# Patient Record
Sex: Female | Born: 1995 | Race: White | Hispanic: Yes | Marital: Married | Smoking: Never smoker
Health system: Southern US, Community
[De-identification: ages and names within clinical notes are randomized; demographics above are authoritative.]

## PROBLEM LIST (undated history)

## (undated) DIAGNOSIS — R000 Tachycardia, unspecified: Secondary | ICD-10-CM

## (undated) DIAGNOSIS — J45909 Unspecified asthma, uncomplicated: Secondary | ICD-10-CM

## (undated) DIAGNOSIS — Z789 Other specified health status: Secondary | ICD-10-CM

## (undated) DIAGNOSIS — R55 Syncope and collapse: Secondary | ICD-10-CM

## (undated) HISTORY — PX: NO PAST SURGERIES: SHX2092

---

## 2020-04-08 ENCOUNTER — Other Ambulatory Visit: Payer: Self-pay

## 2020-04-08 ENCOUNTER — Inpatient Hospital Stay: Payer: 59

## 2020-04-08 ENCOUNTER — Inpatient Hospital Stay (HOSPITAL_COMMUNITY): Payer: 59

## 2020-04-08 ENCOUNTER — Encounter (HOSPITAL_COMMUNITY): Payer: Self-pay | Admitting: Obstetrics and Gynecology

## 2020-04-08 ENCOUNTER — Inpatient Hospital Stay (HOSPITAL_COMMUNITY)
Admission: AD | Admit: 2020-04-08 | Discharge: 2020-04-08 | Disposition: A | Discharge status: Home / Self Care | Payer: 59 | Attending: Obstetrics and Gynecology | Admitting: Obstetrics and Gynecology

## 2020-04-08 DIAGNOSIS — O26891 Other specified pregnancy related conditions, first trimester: Secondary | ICD-10-CM | POA: Diagnosis not present

## 2020-04-08 DIAGNOSIS — O00101 Right tubal pregnancy without intrauterine pregnancy: Secondary | ICD-10-CM | POA: Diagnosis not present

## 2020-04-08 DIAGNOSIS — R1030 Lower abdominal pain, unspecified: Secondary | ICD-10-CM | POA: Insufficient documentation

## 2020-04-08 DIAGNOSIS — O009 Unspecified ectopic pregnancy without intrauterine pregnancy: Secondary | ICD-10-CM | POA: Diagnosis present

## 2020-04-08 DIAGNOSIS — Z3A01 Less than 8 weeks gestation of pregnancy: Secondary | ICD-10-CM | POA: Insufficient documentation

## 2020-04-08 DIAGNOSIS — O209 Hemorrhage in early pregnancy, unspecified: Secondary | ICD-10-CM

## 2020-04-08 HISTORY — DX: Other specified health status: Z78.9

## 2020-04-08 HISTORY — DX: Unspecified asthma, uncomplicated: J45.909

## 2020-04-08 LAB — CBC
HCT: 38.9 % (ref 36.0–46.0)
Hemoglobin: 13 g/dL (ref 12.0–15.0)
MCH: 27.8 pg (ref 26.0–34.0)
MCHC: 33.4 g/dL (ref 30.0–36.0)
MCV: 83.3 fL (ref 80.0–100.0)
Platelets: 230 10*3/uL (ref 150–400)
RBC: 4.67 MIL/uL (ref 3.87–5.11)
RDW: 13.5 % (ref 11.5–15.5)
WBC: 8.2 10*3/uL (ref 4.0–10.5)
nRBC: 0 % (ref 0.0–0.2)

## 2020-04-08 LAB — WET PREP, GENITAL
Clue Cells Wet Prep HPF POC: NONE SEEN
Sperm: NONE SEEN
Trich, Wet Prep: NONE SEEN
Yeast Wet Prep HPF POC: NONE SEEN

## 2020-04-08 LAB — TYPE AND SCREEN
ABO/RH(D): A POS
Antibody Screen: NEGATIVE

## 2020-04-08 LAB — COMPREHENSIVE METABOLIC PANEL
ALT: 23 U/L (ref 0–44)
AST: 19 U/L (ref 15–41)
Albumin: 4 g/dL (ref 3.5–5.0)
Alkaline Phosphatase: 38 U/L (ref 38–126)
Anion gap: 10 (ref 5–15)
BUN: 6 mg/dL (ref 6–20)
CO2: 20 mmol/L — ABNORMAL LOW (ref 22–32)
Calcium: 9.1 mg/dL (ref 8.9–10.3)
Chloride: 106 mmol/L (ref 98–111)
Creatinine, Ser: 0.56 mg/dL (ref 0.44–1.00)
GFR, Estimated: >60 mL/min (ref >60)
Glucose, Bld: 105 mg/dL — ABNORMAL HIGH (ref 70–99)
Potassium: 3.7 mmol/L (ref 3.5–5.1)
Sodium: 136 mmol/L (ref 135–145)
Total Bilirubin: 0.7 mg/dL (ref 0.3–1.2)
Total Protein: 7 g/dL (ref 6.5–8.1)

## 2020-04-08 LAB — URINALYSIS, ROUTINE W REFLEX MICROSCOPIC
Bilirubin Urine: NEGATIVE
Glucose, UA: NEGATIVE mg/dL
Ketones, ur: NEGATIVE mg/dL
Leukocytes,Ua: NEGATIVE
Nitrite: NEGATIVE
Protein, ur: NEGATIVE mg/dL
Specific Gravity, Urine: 1.018 (ref 1.005–1.030)
pH: 7 (ref 5.0–8.0)

## 2020-04-08 LAB — ABO/RH: ABO/RH(D): A POS

## 2020-04-08 LAB — HCG, QUANTITATIVE, PREGNANCY: hCG, Beta Chain, Quant, S: 2438 m[IU]/mL — ABNORMAL HIGH (ref <5)

## 2020-04-08 MED ORDER — METHOTREXATE FOR ECTOPIC PREGNANCY
50.0000 mg/m2 | Freq: Once | INTRAMUSCULAR | Status: AC
  Administered 2020-04-08: 95 mg via INTRAMUSCULAR
  Filled 2020-04-08: qty 1

## 2020-04-08 MED ORDER — ACETAMINOPHEN 325 MG PO TABS: 650.0000 mg | ORAL_TABLET | ORAL | 0 refills | Status: AC | PRN

## 2020-04-08 MED ORDER — PROMETHAZINE HCL 12.5 MG PO TABS: 12.5000 mg | ORAL_TABLET | Freq: Four times a day (QID) | ORAL | 0 refills | Status: DC | PRN

## 2020-04-08 NOTE — MAU Provider Note (Signed)
History     CSN: [id number]  Arrival date and time: 04/08/20 1235  First Provider Initiated Contact with Patient 04/08/20 1438     Chief Complaint  Patient presents with  . Abdominal Pain  . Vaginal Bleeding   HPI Wendy Kidd is a 24 y.o. G1P0 at [redacted]w[redacted]d who presents to MAU with chief complaint of vaginal bleeding and mild abdominal cramping. These are new problems, onset 04/03/2020. Patient states her bleeding started as spotting but has gradually become heavier over time. She denies saturating a pad, weakness, dizziness or syncope. She rates her abdominal pain as 6/10, worse in the morning. She reports accompanying suprapubic tenderness. She has not taken medication or tried other treatments for these complaints. Most recent sexual intercourse was Friday 04/06/2020.  Patient has been NPO since 0800 today when she ate a yogurt and some crackers. She is scheduled for a New OB in the [place] system Tuesday 04/17/2020.  OB History    Gravida  1   Para      Term      Preterm      AB      Living        SAB      TAB      Ectopic      Multiple      Live Births              Past Medical History:  Diagnosis Date  . Asthma   . Medical history non-contributory     Past Surgical History:  Procedure Laterality Date  . NO PAST SURGERIES      No family history on file.  Social History   Tobacco Use  . Smoking status: Never Smoker  . Smokeless tobacco: Never Used  Vaping Use  . Vaping Use: Never used  Substance Use Topics  . Alcohol use: Never  . Drug use: Never    Allergies: No Known Allergies  Medications Prior to Admission  Medication Sig Dispense Refill Last Dose  . Prenatal Vit-Fe Fumarate-FA (MULTIVITAMIN-PRENATAL) 27-0.8 MG TABS tablet Take 1 tablet by mouth daily at 12 noon.       Review of Systems  Gastrointestinal: Positive for abdominal pain.  Genitourinary: Positive for vaginal bleeding.  All other systems reviewed and are  negative.  Physical Exam   Blood pressure 126/62, pulse 80, temperature 98.6 F (37 C), temperature source Oral, resp. rate 16, height $RemoveBe'5\' 8"'RUNiMzbvu$  (1.727 m), weight 71.1 kg, last menstrual period 03/01/2020, SpO2 100 %.  Physical Exam Vitals and nursing note reviewed. Exam conducted with a chaperone present.  Constitutional:      Appearance: She is well-developed.  Cardiovascular:     Rate and Rhythm: Normal rate.     Heart sounds: Normal heart sounds.  Pulmonary:     Effort: Pulmonary effort is normal.     Breath sounds: Normal breath sounds.  Abdominal:     General: Abdomen is flat. Bowel sounds are normal.     Palpations: Abdomen is soft.     Tenderness: There is no abdominal tenderness. There is no right CVA tenderness or left CVA tenderness.  Genitourinary:    Comments: Speculum exam deferred. Scant dark brown discharged noted on pad worn to MAU, no overt bleeding during period of evaluation in MAU Skin:    General: Skin is warm and dry.     Capillary Refill: Capillary refill takes less than 2 seconds.  Neurological:     General: No focal deficit present.  Mental Status: She is alert and oriented to person, place, and time.     MAU Course/MDM  Procedures: formal transvaginal ultrasound  --Patient ROS, imaging and lab results discussed with Dr. Rip Harbour, who agrees patient is appropriate for Methotrexate therapy --Anticipatory guidance given to patient and husband --Patient verbalizes she and her husband are going to [place] for Thanksgiving and will not be in [place] to follow typical MTX followup. Scheduled listed below. Patient to present to MAU Sunday for Day 7 labs rather than Saturday afternoon as advised.    Orders Placed This Encounter  Procedures  . Wet prep, genital  . US OB LESS THAN 14 WEEKS WITH OB TRANSVAGINAL  . Urinalysis, Routine w reflex microscopic Urine, Clean Catch  . CBC  . hCG, quantitative, pregnancy  . Comprehensive metabolic panel  . Diet  NPO time specified  . Nursing communication  . ABO/Rh  . Type and screen [place]   Patient Vitals for the past 24 hrs:  BP Temp Temp src Pulse Resp SpO2 Height Weight  04/08/20 1630 116/66 -- -- 78 16 100 % -- --  04/08/20 1326 126/62 -- -- 80 -- -- -- --  04/08/20 1258 (!) 115/52 98.6 F (37 C) Oral 90 16 100 % $Rem'5\' 8"'yVrS$  (1.727 m) 71.1 kg   Results for orders placed or performed during the hospital encounter of 04/08/20 (from the past 24 hour(s))  Urinalysis, Routine w reflex microscopic Urine, Clean Catch     Status: Abnormal   Collection Time: 04/08/20 12:56 PM  Result Value Ref Range   Color, Urine YELLOW YELLOW   APPearance CLOUDY (A) CLEAR   Specific Gravity, Urine 1.018 1.005 - 1.030   pH 7.0 5.0 - 8.0   Glucose, UA NEGATIVE NEGATIVE mg/dL   Hgb urine dipstick SMALL (A) NEGATIVE   Bilirubin Urine NEGATIVE NEGATIVE   Ketones, ur NEGATIVE NEGATIVE mg/dL   Protein, ur NEGATIVE NEGATIVE mg/dL   Nitrite NEGATIVE NEGATIVE   Leukocytes,Ua NEGATIVE NEGATIVE   RBC / HPF 0-5 0 - 5 RBC/hpf   Bacteria, UA RARE (A) NONE SEEN   Squamous Epithelial / LPF 0-5 0 - 5  CBC     Status: None   Collection Time: 04/08/20  1:20 PM  Result Value Ref Range   WBC 8.2 4.0 - 10.5 K/uL   RBC 4.67 3.87 - 5.11 MIL/uL   Hemoglobin 13.0 12.0 - 15.0 g/dL   HCT 38.9 36 - 46 %   MCV 83.3 80.0 - 100.0 fL   MCH 27.8 26.0 - 34.0 pg   MCHC 33.4 30.0 - 36.0 g/dL   RDW 13.5 11.5 - 15.5 %   Platelets 230 150 - 400 K/uL   nRBC 0.0 0.0 - 0.2 %  hCG, quantitative, pregnancy     Status: Abnormal   Collection Time: 04/08/20  1:20 PM  Result Value Ref Range   hCG, Beta Chain, Quant, S 2,438 (H) <5 mIU/mL  ABO/Rh     Status: None   Collection Time: 04/08/20  1:20 PM  Result Value Ref Range   ABO/RH(D) A POS    No rh immune globuloin      NOT A RH IMMUNE GLOBULIN CANDIDATE, PT RH POSITIVE Performed at [facility], [address], [place], [place] [address]   Wet prep, genital      Status: Abnormal   Collection Time: 04/08/20  1:32 PM   Specimen: PATH Cytology Cervicovaginal Ancillary Only  Result Value Ref Range  Yeast Wet Prep HPF POC NONE SEEN NONE SEEN   Trich, Wet Prep NONE SEEN NONE SEEN   Clue Cells Wet Prep HPF POC NONE SEEN NONE SEEN   WBC, Wet Prep HPF POC FEW (A) NONE SEEN   Sperm NONE SEEN   Comprehensive metabolic panel     Status: Abnormal   Collection Time: 04/08/20  2:59 PM  Result Value Ref Range   Sodium 136 135 - 145 mmol/L   Potassium 3.7 3.5 - 5.1 mmol/L   Chloride 106 98 - 111 mmol/L   CO2 20 (L) 22 - 32 mmol/L   Glucose, Bld 105 (H) 70 - 99 mg/dL   BUN 6 6 - 20 mg/dL   Creatinine, Ser 0.56 0.44 - 1.00 mg/dL   Calcium 9.1 8.9 - 10.3 mg/dL   Total Protein 7.0 6.5 - 8.1 g/dL   Albumin 4.0 3.5 - 5.0 g/dL   AST 19 15 - 41 U/L   ALT 23 0 - 44 U/L   Alkaline Phosphatase 38 38 - 126 U/L   Total Bilirubin 0.7 0.3 - 1.2 mg/dL   GFR, Estimated >60 >60 mL/min   Anion gap 10 5 - 15  Type and screen [place]     Status: None   Collection Time: 04/08/20  2:59 PM  Result Value Ref Range   ABO/RH(D) A POS    Antibody Screen NEG    Sample Expiration      04/11/2020,2359 Performed at [facility], [place] [address], [place], [place] [address]    US OB LESS THAN 14 WEEKS WITH [place] TRANSVAGINAL  Result Date: 04/08/2020 CLINICAL DATA:  Pregnant patient with bleeding. EXAM: OBSTETRIC <14 WK [place] AND TRANSVAGINAL OB US TECHNIQUE: Both transabdominal and transvaginal ultrasound examinations were performed for complete evaluation of the gestation as well as the maternal uterus, adnexal regions, and pelvic cul-de-sac. Transvaginal technique was performed to assess early pregnancy. COMPARISON:  None. FINDINGS: Intrauterine gestational sac: None Maternal uterus/adnexae: Both ovaries are normal in appearance. There is a thick walled cystic structure adjacent to the right ovary. This appears to be separate from the right ovary on  real-time/cine imaging. There may be a subtle yolk sac within this structure. The endometrium is thickened. There are multiple cysts in the endometrium. There is a small amount of simple appearing fluid in the pelvis. IMPRESSION: 1. The findings are consistent with an ectopic pregnancy containing a gestational sac and possibly a tiny yolk sac. 2. There is a small amount of simple fluid in the pelvis. 3. The endometrium is thickened and contains multiple cysts but no intrauterine pregnancy. Findings called to Ms. Maryelizabeth Kaufmann, Nurse Midwife Electronically Signed   By: Dorise Bullion III M.D   On: 04/08/2020 15:14   Methotrexate Treatment Protocol for Ectopic Pregnancy  Pretreatment testing and instructions  hCG concentration  Transvaginal ultrasound  Blood group and Rh(D) typing; give Rhogam 300 mcg IM, if indicated  Complete blood count  Liver and renal function tests  Discontinue folic acid supplements  Counsel patient to avoid NSAIDs, recommend acetaminophen if an analgesic is needed  Advise patient to refrain from sexual intercourse and strenuous exercise   Treatment day  Single dose protocol   1 Sun 11/21 hCG.  Administer Methotrexate 50 mg/m2 body surface area IM  4  Wed 11/24 hCG  7 Sat 11/27 hCG  If <15 percent hCG decline from day 4 to 7, give additional dose of methotrexate 50 mg/m2 IM  If ?  15 percent hCG decline from day 4 to 7, draw hCG weekly until undetectable    The hCG concentration usually declines to less than 15 mIU/mL by 35 days postinjection but may take as long as 109 days. If the hCG does not decline to zero, a new pregnancy should be excluded; if the hCG is rising, a transvaginal ultrasound should be performed. Alternatively, some patients have a slow clearance of serum hCG. If three weekly values are similar, consider an additional dose of MTX (50 mg/m2) not to exceed the recommended maximum of three total doses. This typically accelerates the decline of serum hCG. The  risk of gestational trophoblastic disease is low. Folinic acid rescue is not required for women treated with the single-dose protocol, even if multiple doses are ultimately given.   Prepared with data from:   [facility] Clinical practice. Ectopic pregnancy. [place] Med 2009; 361:379  American College of Obstetricians and Gynecologists. ACOG Practice Bulletin No. 94: Medical management of ectopic pregnancy. Obstet Gynecol 2008; 473:4037.  Meds ordered this encounter  Medications  . methotrexate [facility]) chemo injection kit 95 mg    Right adnexal ectopic pregnancy  . acetaminophen (TYLENOL) 325 MG tablet    Sig: Take 2 tablets (650 mg total) by mouth every 4 (four) hours as needed for up to 7 days for moderate pain.    Dispense:  42 tablet    Refill:  0    Order Specific Question:   Supervising Provider    Answer:   ERVIN, MICHAEL L [[id number]]  . promethazine (PHENERGAN) 12.5 MG tablet    Sig: Take 1 tablet (12.5 mg total) by mouth every 6 (six) hours as needed for nausea or vomiting.    Dispense:  30 tablet    Refill:  0    Order Specific Question:   Supervising Provider    Answer:   Arlina Robes L [[id number]]   Assessment and Plan  --24 y.o. G1P0 with right ectopic pregnancy --S/p Methotrexate per consult with Dr. Rip Harbour --Blood type A POS --Discharge home in stable condition  F/U: --Day 4 labs Wednesday morning in MAU rather than Wednesday at lunchtime (per patient's travel schedule) --Day 7 labs Sunday in MAU rather than Saturday (per patient's travel schedule)  Darlina Rumpf, CNM 04/08/2020, 4:50 PM

## 2020-04-08 NOTE — Discharge Instructions (Signed)
Methotrexate Treatment for an Ectopic Pregnancy, Care After This sheet gives you information about how to care for yourself after your procedure. Your health care provider may also give you more specific instructions. If you have problems or questions, contact your health care provider. What can I expect after the procedure? After the procedure, it is common to have:  Abdominal cramping.  Vaginal bleeding.  Fatigue.  Nausea.  Vomiting.  Diarrhea. Blood tests will be taken at timed intervals for several days or weeks to check your pregnancy hormone levels. The blood tests will be done until the pregnancy hormone can no longer be detected in the blood. Follow these instructions at home: Activity  Do not have sex until your health care provider approves.  Limit activities that take a lot of effort as told by your health care provider. Medicines  Take over the counter and prescription medicines only as told by your health care provider.  Do not take aspirin, ibuprofen, naproxen, or any other NSAIDs.  Do not take folic acid, prenatal vitamins, or other vitamins that contain folic acid. General instructions   Do not drink alcohol.  Follow instructions from your health care provider on how and when to report any symptoms that may indicate a ruptured ectopic pregnancy.  Keep all follow-up visits as told by your health care provider. This is important. Contact a health care provider if:  You have persistent nausea and vomiting.  You have persistent diarrhea.  You are having a reaction to the medicine, such as: ? Tiredness. ? Skin rash. ? Hair loss. Get help right away if:  Your abdominal or pelvic pain gets worse.  You have more vaginal bleeding.  You feel light-headed or you faint.  You have shortness of breath.  Your heart rate increases.  You develop a cough.  You have chills.  You have a fever. Summary  After the procedure, it is common to have symptoms  of abdominal cramping, vaginal bleeding and fatigue. You may also experience other symptoms.  Blood tests will be taken at timed intervals for several days or weeks to check your pregnancy hormone levels. The blood tests will be done until the pregnancy hormone can no longer be detected in the blood.  Limit strenuous activity as told by your health care provider.  Follow instructions from your health care provider on how and when to report any symptoms that may indicate a ruptured ectopic pregnancy. This information is not intended to replace advice given to you by your health care provider. Make sure you discuss any questions you have with your health care provider. Document Revised: 04/17/2017 Document Reviewed: 06/24/2016 Elsevier Patient Education  2020 Elsevier Inc.  

## 2020-04-08 NOTE — MAU Note (Signed)
Wendy Kidd is a 24 y.o. at [redacted]w[redacted]d here in MAU reporting: had + UPT on Wednesday. Has been having some spotting and today it has gotten a little bit heavier. Also having some lower abdominal pain.   LMP: 03/01/20  Onset of complaint: ongoing  Pain score: 6/10  Vitals:   04/08/20 1258  BP: (!) 115/52  Pulse: 90  Resp: 16  Temp: 98.6 F (37 C)  SpO2: 100%     Lab orders placed from triage: UA UPT

## 2020-04-09 LAB — GC/CHLAMYDIA PROBE AMP (~~LOC~~) NOT AT ARMC
Chlamydia: NEGATIVE
Comment: NEGATIVE
Comment: NORMAL
Neisseria Gonorrhea: NEGATIVE

## 2020-04-11 ENCOUNTER — Inpatient Hospital Stay (HOSPITAL_COMMUNITY)
Admission: AD | Admit: 2020-04-11 | Discharge: 2020-04-11 | Discharge status: Against Medical Advice | Payer: 59 | Attending: Obstetrics and Gynecology | Admitting: Obstetrics and Gynecology

## 2020-04-11 ENCOUNTER — Other Ambulatory Visit: Payer: Self-pay

## 2020-04-11 DIAGNOSIS — Z3A01 Less than 8 weeks gestation of pregnancy: Secondary | ICD-10-CM | POA: Insufficient documentation

## 2020-04-11 DIAGNOSIS — Z5329 Procedure and treatment not carried out because of patient's decision for other reasons: Secondary | ICD-10-CM | POA: Insufficient documentation

## 2020-04-11 DIAGNOSIS — O009 Unspecified ectopic pregnancy without intrauterine pregnancy: Secondary | ICD-10-CM | POA: Insufficient documentation

## 2020-04-11 DIAGNOSIS — O26891 Other specified pregnancy related conditions, first trimester: Secondary | ICD-10-CM | POA: Diagnosis present

## 2020-04-11 LAB — HCG, QUANTITATIVE, PREGNANCY: hCG, Beta Chain, Quant, S: 5335 m[IU]/mL — ABNORMAL HIGH (ref <5)

## 2020-04-11 NOTE — MAU Provider Note (Signed)
Subjective:  Wendy Kidd is a 24 y.o. G1P0 at [redacted]w[redacted]d who presents today for FU BHCG. She was seen on 11/21. Results from that day show an ectopic pregnancy; abdominal or pelvic pain. Her Quant on 11/21 was 2438   Objective:  Physical Exam  Nursing note and vitals reviewed. Constitutional: She is oriented to person, place, and time. She appears well-developed and well-nourished. No distress.  HENT:  Head: Normocephalic.  Cardiovascular: Normal rate.  Respiratory: Effort normal.  GI: Soft. There is no tenderness.  Neurological: She is alert and oriented to person, place, and time. Skin: Skin is warm and dry.  Psychiatric: She has a normal mood and affect.   Results for orders placed or performed during the hospital encounter of 04/11/20 (from the past 24 hour(s))  hCG, quantitative, pregnancy     Status: Abnormal   Collection Time: 04/11/20 10:11 AM  Result Value Ref Range   hCG, Beta Chain, Quant, S 5,335 (H) <5 mIU/mL    Patient left MAU prior to being seen.  MSE not done Attempted to call the patient multiple times to discuss results. No answer   Assessment/Plan: Ectopic pregnancy Status post MTX HCG level doubled. Return Sunday for stat labs.   Duane Lope, NP 04/14/2020 4:11 PM

## 2020-04-11 NOTE — MAU Note (Signed)
Patient called from lobby, no response. 

## 2020-04-11 NOTE — MAU Note (Signed)
Patient called from lobby, no answer

## 2020-04-11 NOTE — MAU Note (Signed)
Patient called from lobby X2, no answer

## 2020-04-14 ENCOUNTER — Telehealth: Payer: Self-pay | Admitting: Obstetrics and Gynecology

## 2020-04-14 NOTE — Telephone Encounter (Signed)
Discussed Hcg levels (day 4) labs. She had left prior to speaking to a provider. She is supposed to come back tomorrow for day 7 labs, discussed importance of coming to MAU.  She reports no pain at this time, did have some mild cramping throughout the last few days. Continues to have mild spotting.  Duane Lope, NP 04/14/2020 6:10 PM

## 2020-04-14 NOTE — Telephone Encounter (Signed)
Attempted to call patient x2.  It is very important that the patient come in for day 7 labs following MTX.   Wendy Kidd I, NP  

## 2020-04-15 ENCOUNTER — Inpatient Hospital Stay (HOSPITAL_COMMUNITY): Payer: 59 | Admitting: Registered Nurse

## 2020-04-15 ENCOUNTER — Other Ambulatory Visit: Payer: Self-pay

## 2020-04-15 ENCOUNTER — Inpatient Hospital Stay (HOSPITAL_COMMUNITY): Payer: 59

## 2020-04-15 ENCOUNTER — Ambulatory Visit (HOSPITAL_COMMUNITY)
Admission: AD | Admit: 2020-04-15 | Discharge: 2020-04-16 | Disposition: A | Discharge status: Home / Self Care | Payer: 59 | Attending: Family Medicine | Admitting: Family Medicine

## 2020-04-15 ENCOUNTER — Encounter (HOSPITAL_COMMUNITY): Payer: Self-pay | Admitting: Family Medicine

## 2020-04-15 ENCOUNTER — Encounter (HOSPITAL_COMMUNITY)
Admission: AD | Disposition: A | Discharge status: Home / Self Care | Payer: Self-pay | Source: Home / Self Care | Attending: Family Medicine

## 2020-04-15 DIAGNOSIS — O00109 Unspecified tubal pregnancy without intrauterine pregnancy: Secondary | ICD-10-CM | POA: Diagnosis not present

## 2020-04-15 DIAGNOSIS — Z20822 Contact with and (suspected) exposure to covid-19: Secondary | ICD-10-CM | POA: Insufficient documentation

## 2020-04-15 DIAGNOSIS — O00101 Right tubal pregnancy without intrauterine pregnancy: Secondary | ICD-10-CM | POA: Diagnosis not present

## 2020-04-15 DIAGNOSIS — O009 Unspecified ectopic pregnancy without intrauterine pregnancy: Secondary | ICD-10-CM | POA: Diagnosis present

## 2020-04-15 DIAGNOSIS — Z3A01 Less than 8 weeks gestation of pregnancy: Secondary | ICD-10-CM | POA: Insufficient documentation

## 2020-04-15 HISTORY — PX: DIAGNOSTIC LAPAROSCOPY WITH REMOVAL OF ECTOPIC PREGNANCY: SHX6449

## 2020-04-15 LAB — HCG, QUANTITATIVE, PREGNANCY: hCG, Beta Chain, Quant, S: 3420 m[IU]/mL — ABNORMAL HIGH (ref <5)

## 2020-04-15 LAB — CBC
HCT: 38.5 % (ref 36.0–46.0)
Hemoglobin: 12.4 g/dL (ref 12.0–15.0)
MCH: 27.3 pg (ref 26.0–34.0)
MCHC: 32.2 g/dL (ref 30.0–36.0)
MCV: 84.6 fL (ref 80.0–100.0)
Platelets: 265 10*3/uL (ref 150–400)
RBC: 4.55 MIL/uL (ref 3.87–5.11)
RDW: 13.9 % (ref 11.5–15.5)
WBC: 13.7 10*3/uL — ABNORMAL HIGH (ref 4.0–10.5)
nRBC: 0 % (ref 0.0–0.2)

## 2020-04-15 LAB — RESP PANEL BY RT-PCR (FLU A&B, COVID) ARPGX2
Influenza A by PCR: NEGATIVE
Influenza B by PCR: NEGATIVE
SARS Coronavirus 2 by RT PCR: NEGATIVE

## 2020-04-15 LAB — TYPE AND SCREEN
ABO/RH(D): A POS
Antibody Screen: NEGATIVE

## 2020-04-15 SURGERY — LAPAROSCOPY, WITH ECTOPIC PREGNANCY SURGICAL TREATMENT
Anesthesia: General | Laterality: Right

## 2020-04-15 MED ORDER — MIDAZOLAM HCL 2 MG/2ML IJ SOLN
INTRAMUSCULAR | Status: AC
  Filled 2020-04-15: qty 2

## 2020-04-15 MED ORDER — FENTANYL CITRATE (PF) 250 MCG/5ML IJ SOLN
INTRAMUSCULAR | Status: AC
  Filled 2020-04-15: qty 5

## 2020-04-15 MED ORDER — HYDROMORPHONE HCL 1 MG/ML IJ SOLN
INTRAMUSCULAR | Status: AC
  Filled 2020-04-15: qty 1

## 2020-04-15 MED ORDER — ONDANSETRON HCL 4 MG/2ML IJ SOLN
INTRAMUSCULAR | Status: DC | PRN
  Administered 2020-04-15: 4 mg via INTRAVENOUS

## 2020-04-15 MED ORDER — KETOROLAC TROMETHAMINE 30 MG/ML IJ SOLN
INTRAMUSCULAR | Status: DC | PRN
  Administered 2020-04-15: 30 mg via INTRAVENOUS

## 2020-04-15 MED ORDER — PROMETHAZINE HCL 25 MG/ML IJ SOLN: 6.2500 mg | INTRAMUSCULAR | Status: DC | PRN

## 2020-04-15 MED ORDER — LIDOCAINE 2% (20 MG/ML) 5 ML SYRINGE
INTRAMUSCULAR | Status: DC | PRN
  Administered 2020-04-15: 20 mg via INTRAVENOUS

## 2020-04-15 MED ORDER — BUPIVACAINE HCL (PF) 0.25 % IJ SOLN
INTRAMUSCULAR | Status: AC
  Filled 2020-04-15: qty 30

## 2020-04-15 MED ORDER — SODIUM CHLORIDE 0.9 % IR SOLN
Status: DC | PRN
  Administered 2020-04-15: 3000 mL

## 2020-04-15 MED ORDER — SODIUM CHLORIDE 0.9 % IV SOLN: INTRAVENOUS | Status: DC | PRN

## 2020-04-15 MED ORDER — ARTIFICIAL TEARS OPHTHALMIC OINT
TOPICAL_OINTMENT | OPHTHALMIC | Status: DC | PRN
  Administered 2020-04-15: 1 via OPHTHALMIC

## 2020-04-15 MED ORDER — DEXAMETHASONE SODIUM PHOSPHATE 10 MG/ML IJ SOLN
INTRAMUSCULAR | Status: DC | PRN
  Administered 2020-04-15: 10 mg via INTRAVENOUS

## 2020-04-15 MED ORDER — MIDAZOLAM HCL 5 MG/5ML IJ SOLN
INTRAMUSCULAR | Status: DC | PRN
  Administered 2020-04-15: 2 mg via INTRAVENOUS

## 2020-04-15 MED ORDER — FENTANYL CITRATE (PF) 250 MCG/5ML IJ SOLN
INTRAMUSCULAR | Status: DC | PRN
  Administered 2020-04-15 (×2): 50 ug via INTRAVENOUS
  Administered 2020-04-15 (×2): 25 ug via INTRAVENOUS
  Administered 2020-04-15 (×2): 50 ug via INTRAVENOUS

## 2020-04-15 MED ORDER — OXYCODONE HCL 5 MG PO TABS: 5.0000 mg | ORAL_TABLET | Freq: Four times a day (QID) | ORAL | 0 refills | Status: DC | PRN

## 2020-04-15 MED ORDER — EPHEDRINE 5 MG/ML INJ
INTRAVENOUS | Status: AC
  Filled 2020-04-15: qty 10

## 2020-04-15 MED ORDER — HYDROMORPHONE HCL 1 MG/ML IJ SOLN
0.2500 mg | INTRAMUSCULAR | Status: DC | PRN
  Administered 2020-04-15 (×2): 0.25 mg via INTRAVENOUS
  Administered 2020-04-15: 0.5 mg via INTRAVENOUS

## 2020-04-15 MED ORDER — PROPOFOL 10 MG/ML IV BOLUS
INTRAVENOUS | Status: DC | PRN
  Administered 2020-04-15: 125 mg via INTRAVENOUS

## 2020-04-15 MED ORDER — ROCURONIUM BROMIDE 10 MG/ML (PF) SYRINGE
PREFILLED_SYRINGE | INTRAVENOUS | Status: AC
  Filled 2020-04-15: qty 10

## 2020-04-15 MED ORDER — OXYCODONE HCL 5 MG PO TABS: 5.0000 mg | ORAL_TABLET | Freq: Once | ORAL | Status: DC | PRN

## 2020-04-15 MED ORDER — MEPERIDINE HCL 25 MG/ML IJ SOLN: 6.2500 mg | INTRAMUSCULAR | Status: DC | PRN

## 2020-04-15 MED ORDER — SCOPOLAMINE 1 MG/3DAYS TD PT72
MEDICATED_PATCH | TRANSDERMAL | Status: AC
  Filled 2020-04-15: qty 1

## 2020-04-15 MED ORDER — ROCURONIUM BROMIDE 10 MG/ML (PF) SYRINGE
PREFILLED_SYRINGE | INTRAVENOUS | Status: DC | PRN
  Administered 2020-04-15: 50 mg via INTRAVENOUS

## 2020-04-15 MED ORDER — DIPHENHYDRAMINE HCL 50 MG/ML IJ SOLN
INTRAMUSCULAR | Status: DC | PRN
  Administered 2020-04-15: 6.25 mg via INTRAVENOUS

## 2020-04-15 MED ORDER — SUCCINYLCHOLINE CHLORIDE 200 MG/10ML IV SOSY
PREFILLED_SYRINGE | INTRAVENOUS | Status: DC | PRN
  Administered 2020-04-15: 100 mg via INTRAVENOUS

## 2020-04-15 MED ORDER — MIDAZOLAM HCL 2 MG/2ML IJ SOLN: 0.5000 mg | Freq: Once | INTRAMUSCULAR | Status: DC | PRN

## 2020-04-15 MED ORDER — PHENYLEPHRINE 40 MCG/ML (10ML) SYRINGE FOR IV PUSH (FOR BLOOD PRESSURE SUPPORT)
PREFILLED_SYRINGE | INTRAVENOUS | Status: AC
  Filled 2020-04-15: qty 10

## 2020-04-15 MED ORDER — SCOPOLAMINE 1 MG/3DAYS TD PT72
MEDICATED_PATCH | TRANSDERMAL | Status: DC | PRN
  Administered 2020-04-15: 1 via TRANSDERMAL

## 2020-04-15 MED ORDER — OXYCODONE HCL 5 MG/5ML PO SOLN: 5.0000 mg | Freq: Once | ORAL | Status: DC | PRN

## 2020-04-15 MED ORDER — SUGAMMADEX SODIUM 200 MG/2ML IV SOLN
INTRAVENOUS | Status: DC | PRN
  Administered 2020-04-15: 200 mg via INTRAVENOUS

## 2020-04-15 MED ORDER — PROPOFOL 10 MG/ML IV BOLUS
INTRAVENOUS | Status: AC
  Filled 2020-04-15: qty 20

## 2020-04-15 MED ORDER — LIDOCAINE HCL (PF) 2 % IJ SOLN
INTRAMUSCULAR | Status: AC
  Filled 2020-04-15: qty 5

## 2020-04-15 MED ORDER — SUCCINYLCHOLINE CHLORIDE 200 MG/10ML IV SOSY
PREFILLED_SYRINGE | INTRAVENOUS | Status: AC
  Filled 2020-04-15: qty 10

## 2020-04-15 MED ORDER — BUPIVACAINE HCL (PF) 0.25 % IJ SOLN
INTRAMUSCULAR | Status: DC | PRN
  Administered 2020-04-15: 13 mL

## 2020-04-15 SURGICAL SUPPLY — 28 items
BLADE SURG 15 STRL LF DISP TIS (BLADE) ×1 IMPLANT
BLADE SURG 15 STRL SS (BLADE) ×2
COVER MAYO STAND STRL (DRAPES) ×3 IMPLANT
DRSG OPSITE POSTOP 3X4 (GAUZE/BANDAGES/DRESSINGS) ×3 IMPLANT
DURAPREP 26ML APPLICATOR (WOUND CARE) ×3 IMPLANT
GLOVE BIOGEL PI IND STRL 7.0 (GLOVE) ×4 IMPLANT
GLOVE BIOGEL PI INDICATOR 7.0 (GLOVE) ×8
GLOVE ECLIPSE 7.0 STRL STRAW (GLOVE) ×3 IMPLANT
GOWN STRL REUS W/ TWL LRG LVL3 (GOWN DISPOSABLE) ×3 IMPLANT
GOWN STRL REUS W/TWL LRG LVL3 (GOWN DISPOSABLE) ×6
KIT TURNOVER KIT B (KITS) ×3 IMPLANT
PACK LAPAROSCOPY BASIN (CUSTOM PROCEDURE TRAY) ×3 IMPLANT
PACK TRENDGUARD 450 HYBRID PRO (MISCELLANEOUS) ×1 IMPLANT
POUCH SPECIMEN RETRIEVAL 10MM (ENDOMECHANICALS) ×3 IMPLANT
PROTECTOR NERVE ULNAR (MISCELLANEOUS) ×6 IMPLANT
SET IRRIG TUBING LAPAROSCOPIC (IRRIGATION / IRRIGATOR) ×3 IMPLANT
SET TUBE SMOKE EVAC HIGH FLOW (TUBING) ×3 IMPLANT
SHEARS HARMONIC ACE PLUS 36CM (ENDOMECHANICALS) ×3 IMPLANT
SLEEVE ENDOPATH XCEL 5M (ENDOMECHANICALS) ×3 IMPLANT
SUT VIC AB 3-0 X1 27 (SUTURE) ×3 IMPLANT
SUT VICRYL 0 UR6 27IN ABS (SUTURE) ×6 IMPLANT
TOWEL GREEN STERILE FF (TOWEL DISPOSABLE) ×6 IMPLANT
TRAY FOLEY W/BAG SLVR 14FR (SET/KITS/TRAYS/PACK) ×3 IMPLANT
TRENDGUARD 450 HYBRID PRO PACK (MISCELLANEOUS) ×3
TROCAR BALLN 12MMX100 BLUNT (TROCAR) ×3 IMPLANT
TROCAR XCEL NON-BLD 11X100MML (ENDOMECHANICALS) ×3 IMPLANT
TROCAR XCEL NON-BLD 5MMX100MML (ENDOMECHANICALS) ×3 IMPLANT
WARMER LAPAROSCOPE (MISCELLANEOUS) ×3 IMPLANT

## 2020-04-15 NOTE — Anesthesia Postprocedure Evaluation (Signed)
Anesthesia Post Note  Patient: Wendy Kidd  Procedure(s) Performed: DIAGNOSTIC LAPAROSCOPY WITH REMOVAL OF ECTOPIC PREGNANCY (Right )     Patient location during evaluation: PACU Anesthesia Type: General Level of consciousness: awake and alert, patient cooperative and oriented Pain management: pain level controlled Vital Signs Assessment: post-procedure vital signs reviewed and stable Respiratory status: spontaneous breathing, nonlabored ventilation and respiratory function stable Cardiovascular status: blood pressure returned to baseline and stable Postop Assessment: no apparent nausea or vomiting and adequate PO intake Anesthetic complications: no   No complications documented.  Last Vitals:  Vitals:   04/15/20 2240 04/15/20 2253  BP:  97/80  Pulse:  (!) 101  Resp:  (!) 21  Temp: 36.7 C   SpO2:  100%    Last Pain:  Vitals:   04/15/20 2314  TempSrc:   PainSc: 4                  Asli Tokarski,E. Donatello Kleve

## 2020-04-15 NOTE — Anesthesia Procedure Notes (Signed)
Procedure Name: Intubation Date/Time: 04/15/2020 9:30 PM Performed by: Jearld Pies, CRNA Pre-anesthesia Checklist: Patient identified, Emergency Drugs available, Suction available and Patient being monitored Patient Re-evaluated:Patient Re-evaluated prior to induction Oxygen Delivery Method: Circle System Utilized Preoxygenation: Pre-oxygenation with 100% oxygen Induction Type: IV induction, Rapid sequence and Cricoid Pressure applied Laryngoscope Size: Mac and 3 Grade View: Grade I Tube type: Oral Tube size: 7.0 mm Number of attempts: 1 Airway Equipment and Method: Stylet Placement Confirmation: ETT inserted through vocal cords under direct vision,  positive ETCO2 and breath sounds checked- equal and bilateral Secured at: 21 cm Tube secured with: Tape Dental Injury: Teeth and Oropharynx as per pre-operative assessment

## 2020-04-15 NOTE — Transfer of Care (Signed)
Immediate Anesthesia Transfer of Care Note  Patient: Wendy Kidd  Procedure(s) Performed: DIAGNOSTIC LAPAROSCOPY WITH REMOVAL OF ECTOPIC PREGNANCY (Right )  Patient Location: PACU  Anesthesia Type:General  Level of Consciousness: awake, alert  and oriented  Airway & Oxygen Therapy: Patient Spontanous Breathing  Post-op Assessment: Report given to RN and Post -op Vital signs reviewed and stable  Post vital signs: Reviewed and stable  Last Vitals:  Vitals Value Taken Time  BP 109/62 04/15/20 2237  Temp    Pulse 93 04/15/20 2240  Resp 28 04/15/20 2240  SpO2 100 % 04/15/20 2240  Vitals shown include unvalidated device data.  Last Pain:  Vitals:   04/15/20 1929  TempSrc:   PainSc: 0-No pain         Complications: No complications documented.

## 2020-04-15 NOTE — MAU Provider Note (Signed)
History     CSN: [id number]  Arrival date and time: 04/15/20 1727   None     Chief Complaint  Patient presents with  . Follow-up   Wendy Kidd is a 24 y.o. G1P0 at [redacted]w[redacted]d with known ectopic pregnancy.  She presents today for Follow-up.  Patient reports pain in lower abdominal area that has worsened in the past 3 days, but is improved with tylenol dosing.  However, patient states pain is worsened with movement despite tylenol. Patient denies issues with She reports continued spotting and denies issues with urination.      OB History    Gravida  1   Para      Term      Preterm      AB      Living        SAB      TAB      Ectopic      Multiple      Live Births              Past Medical History:  Diagnosis Date  . Asthma   . Medical history non-contributory     Past Surgical History:  Procedure Laterality Date  . NO PAST SURGERIES      No family history on file.  Social History   Tobacco Use  . Smoking status: Never Smoker  . Smokeless tobacco: Never Used  Vaping Use  . Vaping Use: Never used  Substance Use Topics  . Alcohol use: Never  . Drug use: Never    Allergies: No Known Allergies  Medications Prior to Admission  Medication Sig Dispense Refill Last Dose  . acetaminophen (TYLENOL) 325 MG tablet Take 2 tablets (650 mg total) by mouth every 4 (four) hours as needed for up to 7 days for moderate pain. 42 tablet 0   . promethazine (PHENERGAN) 12.5 MG tablet Take 1 tablet (12.5 mg total) by mouth every 6 (six) hours as needed for nausea or vomiting. 30 tablet 0     Review of Systems  Constitutional: Negative for chills and fever.  Genitourinary: Positive for vaginal bleeding.  Neurological: Negative for dizziness and headaches.   Physical Exam   Blood pressure (!) 125/48, pulse 88, temperature 99.1 F (37.3 C), temperature source Oral, resp. rate 16, height 5\' 8"  (1.727 m), weight 70.5 kg, last menstrual period 03/01/2020, SpO2 99  %.  Physical Exam Vitals and nursing note reviewed.  Constitutional:      General: She is not in acute distress.    Appearance: Normal appearance. She is not toxic-appearing.  HENT:     Head: Normocephalic and atraumatic.  Eyes:     Conjunctiva/sclera: Conjunctivae normal.  Cardiovascular:     Rate and Rhythm: Normal rate and regular rhythm.     Pulses: Normal pulses.     Heart sounds: Normal heart sounds.  Pulmonary:     Effort: Pulmonary effort is normal. No respiratory distress.     Breath sounds: Normal breath sounds.  Abdominal:     General: Bowel sounds are normal. There is no distension.     Palpations: Abdomen is soft.     Tenderness: There is abdominal tenderness. There is no guarding or rebound.  Musculoskeletal:        General: Normal range of motion.     Cervical back: Normal range of motion.  Skin:    General: Skin is warm and dry.  Neurological:     Mental Status: She is alert  and oriented to person, place, and time.  Psychiatric:        Mood and Affect: Mood normal.        Behavior: Behavior normal.        Thought Content: Thought content normal.     MAU Course  Procedures Results for orders placed or performed during the hospital encounter of 04/15/20 (from the past 24 hour(s))  hCG, quantitative, pregnancy     Status: Abnormal   Collection Time: 04/15/20  5:42 PM  Result Value Ref Range   hCG, Beta Chain, Quant, S 3,420 (H) <5 mIU/mL   US OB Transvaginal  Addendum Date: 04/15/2020   ADDENDUM REPORT: 04/15/2020 19:22 ADDENDUM: These results were called by telephone at the time of interpretation on 04/15/2020 at 6:55 pm to provider CNM Gillermo Murdoch , who verbally acknowledged these results. Electronically Signed   By: Tish Frederickson M.D.   On: 04/15/2020 19:22   Result Date: 04/15/2020 CLINICAL DATA:  Known ectopic pending C. Quantitative beta HCG increased to 5,335. Presenting with pain. Gestational age by last menstrual period of 6 weeks and 3  days. Last menstrual period 03/01/2020. EXAM: OBSTETRIC <14 WK [place] AND TRANSVAGINAL OB US TECHNIQUE: Both transabdominal and transvaginal ultrasound examinations were performed for complete evaluation of the gestation as well as the maternal uterus, adnexal regions, and pelvic cul-de-sac. Transvaginal technique was performed to assess early pregnancy. COMPARISON:  Ultrasound pelvis 04/08/2020. FINDINGS: Intrauterine gestational sac: None Yolk sac:  Not Visualized. Embryo:  Not Visualized. Cardiac Activity: Not Visualized. Subchorionic hemorrhage:  None visualized. Maternal uterus/adnexae: Redemonstration of a thick-walled cystic structure within the right adnexa adjacent to the right ovary. No yolk sac or embryo identified within the cystic structure. The right ovary measures 4 x 2.4 x 2.1 cm with a volume of 10.6 mL. The left ovary measures 3.8 x 2.7 x 2.4 cm with a volume of 12.4 ml. Similar-appearing heterogeneous endometrium measuring up to 12 mm with a couple of cystic lesions within the endometrial canal. Large volume complex fluid within the abdomen and pelvis. IMPRESSION: Persistent thick walled cystic structure within the right adnexa adjacent to the right ovary suggestive of a persistent right ectopic pregnancy (no embryo or heart rate identified) with associated large volume complex fluid within the abdomen and pelvis likely representing blood products. Findings concerning for a ruptured ectopic pregnancy. Electronically Signed: By: Tish Frederickson M.D. On: 04/15/2020 19:02     MDM -Labs: bHCG -[place] Assessment and Plan  24 year old Known  Ectopic Abdominal Pain  -Nurse instructed to collect and hold admit labs.  -Patient informed of increased results from Day 4 and concern for rupture with onset of pain.   -Discussed repeating US to confirm that ectopic remains stable. -Patient husband to bedside and updated on POC.  Questions if Isidor Holts is contributing to symptoms and informed that it is  not. -Will send for [place] and await results. -Patient reports last eating this am.   Cherre Robins 04/15/2020, 6:34 PM   Reassessment (7:13 PM)  -[place] results as above and received via phone by D. Lodema Hong, CNM -Nurse's instructed to order Covid swab -Dr. Gildardo Griffes notified of findings. -Provider to bedside and patient notified of findings and recommendation for surgery. -Questions and concerns addressed within this provider's scope.  Encouraged to address surgery related questions/concerns with MD. -Patient and husband expresses appreciation for care received.   Cherre Robins MSN, CNM Advanced Practice Provider, [facility]

## 2020-04-15 NOTE — Op Note (Signed)
PREOPERATIVE DIAGNOSIS: Probable ruptured ectopic pregnancy  POSTOPERATIVE DIAGNOSIS: Same  PROCEDURE: Laparoscopic right salpingectomy   INDICATIONS: 24 y.o. G1P0 at [redacted]w[redacted]d here for with ruptured ectopic pregnancy with blood type A pos. Patient was counseled regarding need for laparoscopic salpingectomy. Risks of surgery including bleeding which may require transfusion or reoperation, infection, injury to bowel or other surrounding organs, need for additional procedures including laparotomy and other postoperative/anesthesia complications were explained to patient.  Written informed consent was obtained  FINDINGS: moderate amount of hemoperitoneum estimated to be about 200cc of blood and clots.  Dilated right fallopian tube containing ectopic gestation. Small normal appearing uterus, normal left fallopian tube, right ovary and left ovary.  ANESTHESIA: General  SPECIMENS: right fallopian tube to pathology  COMPLICATIONS: None immediate  PROCEDURE IN DETAIL:  The patient was taken to the operating room where general anesthesia was administered and was found to be adequate.  She was placed in the dorsal lithotomy position, and was prepped and draped in a sterile manner.  A Foley catheter was inserted into her bladder and attached to constant drainage and a uterine manipulator was then advanced into the uterus .  After an adequate timeout was performed, attention was then turned to the patient's abdomen where a 10-mm skin incision was made in the umbilical fold. Fascia and peritoneum were entered sharply.  A 0 Vicryl suture was used to tag the fascia circumferentially.  A Hassan trocar was placed. The laparoscope was introduced.  A survey of the patient's pelvis and abdomen revealed the findings as above.  Two left lower quadrant ports were placed under direct visualization; 5-mm x 2.  Attention was then turned to the right fallopian tube which was grasped and ligated from the underlying mesosalpinx and  uterine attachment using the Harmonic instrument.  Good hemostasis was noted.  The specimen was placed in an EndoCatch bag and removed from the abdomen intact. Clot and blood removed with the Nezjhat. The abdomen was desufflated, and all instruments were removed.  The umbilicus incision was closed with the afore mentioned Vicryl suture; One more bite taken to ensure good closure across the incision. Fair amount of oozing from lower port and figure of eight used to ensure hemostasis. All skin incisions were closed with a 3-0 Vicryl subcuticular stitch followed by DermaBond. The patient tolerated the procedure well.  All instruments, needles, and sponge counts were correct x 2. The patient was taken to the recovery room in stable condition.   Reva Bores MD 04/15/2020 10:28 PM

## 2020-04-15 NOTE — Anesthesia Preprocedure Evaluation (Addendum)
Anesthesia Evaluation  Patient identified by MRN, date of birth, ID band Patient awake    Reviewed: Allergy & Precautions, NPO status , Patient's Chart, lab work & pertinent test results  History of Anesthesia Complications Negative for: history of anesthetic complications  Airway Mallampati: I  TM Distance: >3 FB Neck ROM: Full    Dental  (+) Dental Advisory Given, Teeth Intact   Pulmonary asthma (has not needed inhaler in several months) ,  04/15/2020 SARS coronavirus NEG   breath sounds clear to auscultation       Cardiovascular negative cardio ROS   Rhythm:Regular Rate:Normal     Neuro/Psych negative neurological ROS     GI/Hepatic negative GI ROS, Neg liver ROS,   Endo/Other  negative endocrine ROS  Renal/GU negative Renal ROS     Musculoskeletal   Abdominal   Peds  Hematology negative hematology ROS (+)   Anesthesia Other Findings   Reproductive/Obstetrics (+) Pregnancy (ectopic)                            Anesthesia Physical Anesthesia Plan  ASA: II and emergent  Anesthesia Plan: General   Post-op Pain Management:    Induction: Intravenous and Rapid sequence  PONV Risk Score and Plan: 3 and Ondansetron, Dexamethasone, Treatment may vary due to age or medical condition and Scopolamine patch - Pre-op  Airway Management Planned: Oral ETT  Additional Equipment: None  Intra-op Plan:   Post-operative Plan: Extubation in OR  Informed Consent: I have reviewed the patients History and Physical, chart, labs and discussed the procedure including the risks, benefits and alternatives for the proposed anesthesia with the patient or authorized representative who has indicated his/her understanding and acceptance.     Dental advisory given  Plan Discussed with: CRNA and Surgeon  Anesthesia Plan Comments:        Anesthesia Quick Evaluation

## 2020-04-15 NOTE — MAU Note (Signed)
Wendy Kidd is a 24 y.o. at [redacted]w[redacted]d here in MAU reporting: here for Day 7 labs post MTX. States she is having some spotting. States she had a lot of cramping yesterday morning and this afternoon, states took tylenol and that helped a lot. Does report lower abdominal pain that started today  Onset of complaint: ongoing  Pain score: 6/10  Vitals:   04/15/20 1738  BP: (!) 125/48  Pulse: 88  Resp: 16  Temp: 99.1 F (37.3 C)  SpO2: 99%     Lab orders placed from triage: hcg

## 2020-04-15 NOTE — Discharge Instructions (Signed)
Diagnostic Laparoscopy, Care After This sheet gives you information about how to care for yourself after your procedure. Your health care provider may also give you more specific instructions. If you have problems or questions, contact your health care provider. What can I expect after the procedure? After the procedure, it is common to have:  Mild discomfort in the abdomen.  Sore throat. Women who have laparoscopy with pelvic examination may have mild cramping and fluid coming from the vagina for a few days after the procedure. Follow these instructions at home: Medicines  Take over-the-counter and prescription medicines only as told by your health care provider.  If you were prescribed an antibiotic medicine, take it as told by your health care provider. Do not stop taking the antibiotic even if you start to feel better. Driving  Do not drive for 24 hours if you were given a medicine to help you relax (sedative) during your procedure.  Do not drive or use heavy machinery while taking prescription pain medicine. Bathing  Do not take baths, swim, or use a hot tub until your health care provider approves. You may take showers. Incision care   Follow instructions from your health care provider about how to take care of your incisions. Make sure you: ? Wash your hands with soap and water before you change your bandage (dressing). If soap and water are not available, use hand sanitizer. ? Change your dressing as told by your health care provider. ? Leave stitches (sutures), skin glue, or adhesive strips in place. These skin closures may need to stay in place for 2 weeks or longer. If adhesive strip edges start to loosen and curl up, you may trim the loose edges. Do not remove adhesive strips completely unless your health care provider tells you to do that.  Check your incision areas every day for signs of infection. Check for: ? Redness, swelling, or pain. ? Fluid or  blood. ? Warmth. ? Pus or a bad smell. Activity  Return to your normal activities as told by your health care provider. Ask your health care provider what activities are safe for you.  Do not lift anything that is heavier than 10 lb (4.5 kg), or the limit that you are told, until your health care provider says that it is safe. General instructions  To prevent or treat constipation while you are taking prescription pain medicine, your health care provider may recommend that you: ? Drink enough fluid to keep your urine pale yellow. ? Take over-the-counter or prescription medicines. ? Eat foods that are high in fiber, such as fresh fruits and vegetables, whole grains, and beans. ? Limit foods that are high in fat and processed sugars, such as fried and sweet foods.  Do not use any products that contain nicotine or tobacco, such as cigarettes and e-cigarettes. If you need help quitting, ask your health care provider.  Keep all follow-up visits as told by your health care provider. This is important. Contact a health care provider if:  You develop shoulder pain.  You feel lightheaded or faint.  You are unable to pass gas or have a bowel movement.  You feel nauseous or you vomit.  You develop a rash.  You have redness, swelling, or pain around any incision.  You have fluid or blood coming from any incision.  Any incision feels warm to the touch.  You have pus or a bad smell coming from any incision.  You have a fever or chills. Get help   right away if:  You have severe pain.  You have vomiting that does not go away.  You have heavy bleeding from the vagina.  Any incision opens.  You have trouble breathing.  You have chest pain. Summary  After the procedure, it is common to have mild discomfort in the abdomen and a sore throat.  Check your incision areas every day for signs of infection.  Return to your normal activities as told by your health care provider. Ask  your health care provider what activities are safe for you. This information is not intended to replace advice given to you by your health care provider. Make sure you discuss any questions you have with your health care provider. Document Revised: 04/17/2017 Document Reviewed: 10/29/2016 Elsevier Patient Education  2020 [employer] Inc[employer]  

## 2020-04-15 NOTE — H&P (Signed)
Wendy Kidd is an 24 y.o. G1P0 female.   Chief Complaint: abdominal pain HPI: Patient with h/o ectopic s/p MTX and rising levels on day 4 with almost double. Who is here now with reports of pain x 24 hours. Day 7 labs reveal HCG which is dropping but her pain is new and u/s reveals ruptured ectopic.  Past Medical History:  Diagnosis Date  . Asthma   . Medical history non-contributory     Past Surgical History:  Procedure Laterality Date  . NO PAST SURGERIES      History reviewed. No pertinent family history. Social History:  reports that she has never smoked. She has never used smokeless tobacco. She reports that she does not drink alcohol and does not use drugs.  Allergies: No Known Allergies  Medications Prior to Admission  Medication Sig Dispense Refill  . acetaminophen (TYLENOL) 325 MG tablet Take 2 tablets (650 mg total) by mouth every 4 (four) hours as needed for up to 7 days for moderate pain. 42 tablet 0  . promethazine (PHENERGAN) 12.5 MG tablet Take 1 tablet (12.5 mg total) by mouth every 6 (six) hours as needed for nausea or vomiting. 30 tablet 0    A comprehensive review of systems was negative.  Blood pressure (!) 125/48, pulse 88, temperature 99.1 F (37.3 C), temperature source Oral, resp. rate 16, height 5\' 8"  (1.727 m), weight 70.5 kg, last menstrual period 03/01/2020, SpO2 99 %. General appearance: alert, cooperative and appears stated age Head: Normocephalic, without obvious abnormality, atraumatic Neck: supple, symmetrical, trachea midline Lungs: normal effort Heart: regular rate and rhythm Abdomen: soft, + tenderness Extremities: Homans sign is negative, no sign of DVT Skin: Skin color, texture, turgor normal. No rashes or lesions Neurologic: Grossly normal   Lab Results  Component Value Date   WBC 8.2 04/08/2020   HGB 13.0 04/08/2020   HCT 38.9 04/08/2020   MCV 83.3 04/08/2020   PLT 230 04/08/2020   A POS   hCG, Beta Chain, Quant, S <5 mIU/mL  3,420High  5,335High   FINDINGS: Intrauterine gestational sac: None  Yolk sac:  Not Visualized.  Embryo:  Not Visualized.  Cardiac Activity: Not Visualized.  Subchorionic hemorrhage:  None visualized.  Maternal uterus/adnexae:  Redemonstration of a thick-walled cystic structure within the right adnexa adjacent to the right ovary. No yolk sac or embryo identified within the cystic structure.  The right ovary measures 4 x 2.4 x 2.1 cm with a volume of 10.6 mL. The left ovary measures 3.8 x 2.7 x 2.4 cm with a volume of 12.4 ml.  Similar-appearing heterogeneous endometrium measuring up to 12 mm with a couple of cystic lesions within the endometrial canal.  Large volume complex fluid within the abdomen and pelvis.  IMPRESSION: Persistent thick walled cystic structure within the right adnexa adjacent to the right ovary suggestive of a persistent right ectopic pregnancy (no embryo or heart rate identified) with associated large volume complex fluid within the abdomen and pelvis likely representing blood products. Findings concerning for a ruptured ectopic pregnancy.  Assessment/Plan Principal Problem:   Ruptured ectopic pregnancy  To OR for laparoscopic removal.  Risks include but are not limited to bleeding, infection, injury to surrounding structures, including bowel, bladder and ureters, blood clots, and death.  Likelihood of success is high.    04/10/2020 04/15/2020, 7:40 PM

## 2020-04-16 ENCOUNTER — Encounter (HOSPITAL_COMMUNITY): Payer: Self-pay | Admitting: Family Medicine

## 2020-04-16 LAB — RPR: RPR Ser Ql: NONREACTIVE

## 2020-04-17 LAB — SURGICAL PATHOLOGY

## 2020-05-19 NOTE — L&D Delivery Note (Signed)
Operative Delivery Note  G2P0010 at presented for IOL due to A1GDM with normal 1st stage and prolonged second stage. The patient was complete at 0830 and pushed effectively for over 8 hours. She rested about 2 hours total between complete and delivery at 5:11PM. I offered vaccuum assisted delivery at 15:00 and patient chose to continue to push. I personally pushed with the patient from 15:30 until delivered. Fetus has a category 2 tracing with deep variables to the 70-80s with return to baseline 150's. I offered vacuum again at 1.0 hours of pushing and patient declined. I offered again at 1.5 hours of pushing with the patient at 17:00. The patient accepted vacuum at this time.  Verbal consent: obtained from patient.  Risks and benefits discussed in detail.  Risks include, but are not limited to the risks of anesthesia, bleeding, infection, damage to maternal tissues, fetal cephalhematoma.  There is also the risk of inability to effect vaginal delivery of the head, or shoulder dystocia that cannot be resolved by established maneuvers, leading to the need for emergency cesarean section.  I applied the Mighty Vac with the Bell attachment. I performed 3 pulls over 4 pushes with good advancement of the fetal head.  At 5:11 PM a viable female was delivered via Vaginal, Spontaneous.  Presentation: vertex; Position: Right,, Occiput,, Anterior. No nuchal. Shoulders delivered easily. Mother reached down to grasp infant and pull to her abdomen.   APGAR: 8, 9; weight - pending .   Placenta status: Spontaneous and intact   Cord: 3VC  Anesthesia:  Epidural  Instruments: 10 Episiotomy: None Lacerations: 1st degree Suture Repair: 3.0 vicryl Est. Blood Loss (mL):  200  Mom to postpartum.  Baby to Couplet care / Skin to Skin.  Wendy Kidd [facility] 03/27/2021, 6:03 PM

## 2020-05-21 ENCOUNTER — Other Ambulatory Visit: Payer: Self-pay

## 2020-05-21 ENCOUNTER — Ambulatory Visit (INDEPENDENT_AMBULATORY_CARE_PROVIDER_SITE_OTHER): Payer: 59 | Admitting: Family Medicine

## 2020-05-21 ENCOUNTER — Encounter: Payer: Self-pay | Admitting: Family Medicine

## 2020-05-21 VITALS — BP 125/71 | HR 88 | Wt 154.4 lb

## 2020-05-21 DIAGNOSIS — Z09 Encounter for follow-up examination after completed treatment for conditions other than malignant neoplasm: Secondary | ICD-10-CM

## 2020-05-21 NOTE — Patient Instructions (Signed)
Preparing for Pregnancy °If you are considering becoming pregnant, make an appointment to see your regular health care provider to learn how to prepare for a safe and healthy pregnancy (preconception care). During a preconception care visit, your health care provider will: °· Do a complete physical exam, including a Pap test. °· Take a complete medical history. °· Give you information, answer your questions, and help you resolve problems. °Preconception checklist °Medical history °· Tell your health care provider about any current or past medical conditions. Your pregnancy or your ability to become pregnant may be affected by chronic conditions, such as diabetes, chronic hypertension, and thyroid problems. °· Include your family's medical history as well as your partner's medical history. °· Tell your health care provider about any history of STIs (sexually transmitted infections). These can affect your pregnancy. In some cases, they can be passed to your baby. Discuss any concerns that you have about STIs. °· If indicated, discuss the benefits of genetic testing. This testing will show whether there are any genetic conditions that may be passed from you or your partner to your baby. °· Tell your health care provider about: °? Any problems you have had with conception or pregnancy. °? Any medicines you take. These include vitamins, herbal supplements, and over-the-counter medicines. °? Your history of immunizations. Discuss any vaccinations that you may need. °Diet °· Ask your health care provider what to include in a healthy diet that has a balance of nutrients. This is especially important when you are pregnant or preparing to become pregnant. °· Ask your health care provider to help you reach a healthy weight before pregnancy. °? If you are overweight, you may be at higher risk for certain complications, such as high blood pressure, diabetes, and preterm birth. °? If you are underweight, you are more likely to  have a baby who has a low birth weight. °Lifestyle, work, and home °· Let your health care provider know: °? About any lifestyle habits that you have, such as alcohol use, drug use, or smoking. °? About recreational activities that may put you at risk during pregnancy, such as downhill skiing and certain exercise programs. °? Tell your health care provider about any international travel, especially any travel to places with an active Zika virus outbreak. °? About harmful substances that you may be exposed to at work or at home. These include chemicals, pesticides, radiation, or even litter boxes. °? If you do not feel safe at home. °Mental health °· Tell your health care provider about: °? Any history of mental health conditions, including feelings of depression, sadness, or anxiety. °? Any medicines that you take for a mental health condition. These include herbs and supplements. °Home instructions to prepare for pregnancy °Lifestyle ° °· Eat a balanced diet. This includes fresh fruits and vegetables, whole grains, lean meats, low-fat dairy products, healthy fats, and foods that are high in fiber. Ask to meet with a nutritionist or registered dietitian for assistance with meal planning and goals. °· Get regular exercise. Try to be active for at least 30 minutes a day on most days of the week. Ask your health care provider which activities are safe during pregnancy. °· Do not use any products that contain nicotine or tobacco, such as cigarettes and e-cigarettes. If you need help quitting, ask your health care provider. °· Do not drink alcohol. °· Do not take illegal drugs. °· Maintain a healthy weight. Ask your health care provider what weight range is right for you. °General   instructions °· Keep an accurate record of your menstrual periods. This makes it easier for your health care provider to determine your baby's due date. °· Begin taking prenatal vitamins and folic acid supplements daily as directed by your  health care provider. °· Manage any chronic conditions, such as high blood pressure and diabetes, as told by your health care provider. This is important. °How do I know that I am pregnant? °You may be pregnant if you have been sexually active and you miss your period. Symptoms of early pregnancy include: °· Mild cramping. °· Very light vaginal bleeding (spotting). °· Feeling unusually tired. °· Nausea and vomiting (morning sickness). °If you have any of these symptoms and you suspect that you might be pregnant, you can take a home pregnancy test. These tests check for a hormone in your urine (human chorionic gonadotropin, or hCG). A woman's body begins to make this hormone during early pregnancy. These tests are very accurate. Wait until at least the first day after you miss your period to take one. If the test shows that you are pregnant (you get a positive result), call your health care provider to make an appointment for prenatal care. °What should I do if I become pregnant? ° °  ° °· Make an appointment with your health care provider as soon as you suspect you are pregnant. °· Do not use any products that contain nicotine, such as cigarettes, chewing tobacco, and e-cigarettes. If you need help quitting, ask your health care provider. °· Do not drink alcoholic beverages. Alcohol is related to a number of birth defects. °· Avoid toxic odors and chemicals. °· You may continue to have sexual intercourse if it does not cause pain or other problems, such as vaginal bleeding. °This information is not intended to replace advice given to you by your health care provider. Make sure you discuss any questions you have with your health care provider. °Document Revised: 05/07/2017 Document Reviewed: 11/25/2015 °Elsevier Patient Education © 2020 [employer] ° °

## 2020-05-21 NOTE — Progress Notes (Signed)
   Subjective:    Patient ID: Wendy Kidd is a 25 y.o. female presenting with Routine Post Op  on 05/21/2020  HPI: S/p laparoscopy for ectopic. No cycle since after surgery. Desires pregnancy. Healing well.   Review of Systems  Constitutional: Negative for chills and fever.  Respiratory: Negative for shortness of breath.   Cardiovascular: Negative for chest pain.  Gastrointestinal: Negative for abdominal pain, nausea and vomiting.  Genitourinary: Negative for dysuria.  Skin: Negative for rash.      Objective:    BP 125/71   Pulse 88   Wt 154 lb 6.4 oz (70 kg)   LMP 03/01/2020   Breastfeeding Yes   BMI 23.48 kg/m  Physical Exam Constitutional:      General: She is not in acute distress.    Appearance: She is well-developed and well-nourished.  HENT:     Head: Normocephalic and atraumatic.  Eyes:     General: No scleral icterus. Cardiovascular:     Rate and Rhythm: Normal rate.  Pulmonary:     Effort: Pulmonary effort is normal.  Abdominal:     Palpations: Abdomen is soft.  Musculoskeletal:     Cervical back: Neck supple.  Skin:    General: Skin is warm and dry.  Neurological:     Mental Status: She is alert and oriented to person, place, and time.  Psychiatric:        Mood and Affect: Mood and affect normal.         Assessment & Plan:  Postop check - doing well, begin PNVs, if no cycle by next week, check UPT, f/u here to r/o ectopic if happens, reviewed images and pathology    Return if symptoms worsen or fail to improve.  Reva Bores 05/21/2020 11:55 AM

## 2020-07-12 ENCOUNTER — Ambulatory Visit (INDEPENDENT_AMBULATORY_CARE_PROVIDER_SITE_OTHER): Payer: 59

## 2020-07-12 ENCOUNTER — Other Ambulatory Visit: Payer: Self-pay

## 2020-07-12 DIAGNOSIS — Z3201 Encounter for pregnancy test, result positive: Secondary | ICD-10-CM | POA: Diagnosis not present

## 2020-07-12 NOTE — Progress Notes (Signed)
Pt left urine for UPT resulting positive.  Medications/allergies reviewed.  No pain or vaginal bleeding.  Pt reports LMP 06/20/19, EDD 03/26/21, and 3w 2d.  Pt states that she had an ectopic pregnancy prior and wants to know how to prevent again.  I advised pt to continue to monitor for increased pain that can be one sided and/or bleeding like a period to please go to MAU for evaluation.  Pt encouraged to start OB care.  Pt verbalized understanding.   Leonette Nutting  07/12/20

## 2020-07-13 NOTE — Progress Notes (Signed)
Chart reviewed for nurse visit. Agree with plan of care.   Marny Lowenstein, PA-C 07/13/2020 9:49 AM

## 2020-08-01 ENCOUNTER — Encounter: Payer: Self-pay | Admitting: Family Medicine

## 2020-08-08 DIAGNOSIS — Z8759 Personal history of other complications of pregnancy, childbirth and the puerperium: Secondary | ICD-10-CM | POA: Insufficient documentation

## 2020-08-08 HISTORY — DX: Personal history of other complications of pregnancy, childbirth and the puerperium: Z87.59

## 2020-08-08 LAB — OB RESULTS CONSOLE RUBELLA ANTIBODY, IGM: Rubella: IMMUNE

## 2020-08-08 LAB — OB RESULTS CONSOLE HEPATITIS B SURFACE ANTIGEN: Hepatitis B Surface Ag: NEGATIVE

## 2020-08-13 DIAGNOSIS — Z2839 Other underimmunization status: Secondary | ICD-10-CM | POA: Insufficient documentation

## 2020-11-15 ENCOUNTER — Emergency Department (HOSPITAL_COMMUNITY)
Admission: EM | Admit: 2020-11-15 | Discharge: 2020-11-15 | Disposition: A | Discharge status: Home / Self Care | Payer: No Typology Code available for payment source | Attending: Emergency Medicine | Admitting: Emergency Medicine

## 2020-11-15 ENCOUNTER — Encounter (HOSPITAL_COMMUNITY): Payer: Self-pay

## 2020-11-15 ENCOUNTER — Other Ambulatory Visit: Payer: Self-pay

## 2020-11-15 ENCOUNTER — Emergency Department (HOSPITAL_COMMUNITY): Payer: No Typology Code available for payment source

## 2020-11-15 DIAGNOSIS — R109 Unspecified abdominal pain: Secondary | ICD-10-CM

## 2020-11-15 DIAGNOSIS — J45909 Unspecified asthma, uncomplicated: Secondary | ICD-10-CM | POA: Diagnosis not present

## 2020-11-15 DIAGNOSIS — Z3A20 20 weeks gestation of pregnancy: Secondary | ICD-10-CM | POA: Diagnosis not present

## 2020-11-15 DIAGNOSIS — R1030 Lower abdominal pain, unspecified: Secondary | ICD-10-CM | POA: Insufficient documentation

## 2020-11-15 DIAGNOSIS — O26892 Other specified pregnancy related conditions, second trimester: Secondary | ICD-10-CM | POA: Insufficient documentation

## 2020-11-15 LAB — URINALYSIS, ROUTINE W REFLEX MICROSCOPIC
Bilirubin Urine: NEGATIVE
Glucose, UA: NEGATIVE mg/dL
Ketones, ur: NEGATIVE mg/dL
Nitrite: NEGATIVE
Protein, ur: NEGATIVE mg/dL
Specific Gravity, Urine: 1.008 (ref 1.005–1.030)
pH: 7 (ref 5.0–8.0)

## 2020-11-15 NOTE — ED Notes (Signed)
Pt verbalized understanding of d/c and follow up care. Ambulatory with steady gait.  

## 2020-11-15 NOTE — Discharge Instructions (Addendum)
You were evaluated in the Emergency Department and after careful evaluation, we did not find any emergent condition requiring admission or further testing in the hospital.  Your exam/testing today was overall reassuring.  Ultrasound was normal.  We sent your urine sample to the lab for culture and you will be called with abnormal results.  Recommend close follow-up with your OB.  Please return to the Emergency Department if you experience any worsening of your condition.  Thank you for allowing [place] to be a part of your care.

## 2020-11-15 NOTE — ED Triage Notes (Signed)
Pt reports she began to have lower abdominal cramps that started this afternoon that felt like menstrual cramping, but then the pain began to get much worse and has not gone away and radiates into right side of her back.  Pt denies vaginal bleeding.  Pt reports this is her second time being pregnant, the first time was an ectopic pregnancy.

## 2020-11-15 NOTE — ED Provider Notes (Signed)
[facility]-EMERGENCY DEPT [facility] Emergency Department Provider Note MRN:  [id number]  Arrival date & time: 11/15/20     Chief Complaint   Abdominal Cramping (Pregnant )   History of Present Illness   Wendy Kidd is a 25 y.o. year-old female with a history of asthma presenting to the ED with chief complaint of abdominal pain.  Location: Lower abdomen Duration: Several hours Onset: Sudden Timing: Constant, progressively worsening Description: Crampy Severity: Severe Exacerbating/Alleviating Factors: None Associated Symptoms: None.  Patient is about [redacted] weeks pregnant. Pertinent Negatives: Denies fever, no nausea vomiting, no chest pain or shortness of breath, no vaginal bleeding or discharge.   Review of Systems  A complete 10 system review of systems was obtained and all systems are negative except as noted in the HPI and PMH.   Patient's Health History    Past Medical History:  Diagnosis Date   Asthma    Medical history non-contributory     Past Surgical History:  Procedure Laterality Date   DIAGNOSTIC LAPAROSCOPY WITH REMOVAL OF ECTOPIC PREGNANCY Right 04/15/2020   Procedure: DIAGNOSTIC LAPAROSCOPY WITH REMOVAL OF ECTOPIC PREGNANCY;  Surgeon: Reva Bores, MD;  Location: [facility] OR;  Service: Gynecology;  Laterality: Right;   NO PAST SURGERIES      History reviewed. No pertinent family history.  Social History   Socioeconomic History   Marital status: Married    Spouse name: Not on file   Number of children: Not on file   Years of education: Not on file   Highest education level: Not on file  Occupational History   Not on file  Tobacco Use   Smoking status: Never   Smokeless tobacco: Never  Vaping Use   Vaping Use: Never used  Substance and Sexual Activity   Alcohol use: Never   Drug use: Never   Sexual activity: Yes  Other Topics Concern   Not on file  Social History Narrative   Not on file   Social Determinants of Health   Financial Resource Strain:  Not on file  Food Insecurity: No Food Insecurity   Worried About Running Out of Food in the Last Year: Never true   Ran Out of Food in the Last Year: Never true  Transportation Needs: No Transportation Needs   Lack of Transportation (Medical): No   Lack of Transportation (Non-Medical): No  Physical Activity: Not on file  Stress: Not on file  Social Connections: Not on file  Intimate Partner Violence: Not on file     Physical Exam   Vitals:   11/15/20 0321  BP: 119/75  Pulse: 91  Resp: 16  Temp: 98.3 F (36.8 C)  SpO2: 100%    CONSTITUTIONAL: Well-appearing, NAD NEURO:  Alert and oriented x 3, no focal deficits EYES:  eyes equal and reactive ENT/NECK:  no LAD, no JVD CARDIO: Regular rate, well-perfused, normal S1 and S2 PULM:  CTAB no wheezing or rhonchi GI/GU:  normal bowel sounds, non-distended, non-tender MSK/SPINE:  No gross deformities, no edema SKIN:  no rash, atraumatic PSYCH:  Appropriate speech and behavior  *Additional and/or pertinent findings included in MDM below  Diagnostic and Interventional Summary    EKG Interpretation  Date/Time:    Ventricular Rate:    PR Interval:    QRS Duration:   QT Interval:    QTC Calculation:   R Axis:     Text Interpretation:          Labs Reviewed  URINALYSIS, ROUTINE W REFLEX MICROSCOPIC - Abnormal;  Notable for the following components:      Result Value   Hgb urine dipstick MODERATE (*)    Leukocytes,Ua TRACE (*)    Bacteria, UA RARE (*)    All other components within normal limits  URINE CULTURE    US OB Limited > 14 wks  Final Result      Medications - No data to display   Procedures  /  Critical Care Ultrasound ED OB Pelvic  Date/Time: 11/15/2020 5:42 AM Performed by: Sabas Sous, MD Authorized by: Sabas Sous, MD   Procedure details:    Indications: pregnant with abdominal pain     Assess:  Fetal viability   Images: archived    Uterine findings:    Fetal heart rate: identified      Estimated gestational age: 75 weeks Comments:     Fetal heart rate 154, normal fetal movement  ED Course and Medical Decision Making  I have reviewed the triage vital signs, the nursing notes, and pertinent available records from the EMR.  Listed above are laboratory and imaging tests that I personally ordered, reviewed, and interpreted and then considered in my medical decision making (see below for details).  At first mild lower abdominal cramping this afternoon, became severe this evening, now is resolved.  Patient is sitting comfortably with normal vital signs, benign abdomen.  No vaginal bleeding or discharge or leakage of fluid.  No fever.  Could be explained by Deberah Pelton, also considering concealed abruption.  Bedside ultrasound showing reassuring fetal heart rate of 154, normal fetal movement.  Will obtain formal ultrasound for better evaluation of the placenta.     For ultrasound is reassuring, urine sent to lab for culture to see if treatment for bacteria is necessary.  Appropriate for discharge.  Elmer Sow. Pilar Plate, MD [facility] Emergency Medicine [facility] [email or web address]  Final Clinical Impressions(s) / ED Diagnoses     ICD-10-CM   1. Abdominal cramping affecting pregnancy  O26.899    R10.9       ED Discharge Orders     None        Discharge Instructions Discussed with and Provided to Patient:     Discharge Instructions      You were evaluated in the Emergency Department and after careful evaluation, we did not find any emergent condition requiring admission or further testing in the hospital.  Your exam/testing today was overall reassuring.  Ultrasound was normal.  We sent your urine sample to the lab for culture and you will be called with abnormal results.  Recommend close follow-up with your OB.  Please return to the Emergency Department if you experience any worsening of your condition.  Thank you for allowing [place] to be a part  of your care.         Sabas Sous, MD 11/15/20 [phone]

## 2020-11-16 LAB — URINE CULTURE: Culture: 10000 — AB

## 2020-11-17 ENCOUNTER — Telehealth: Payer: Self-pay | Admitting: Emergency Medicine

## 2020-11-17 NOTE — Progress Notes (Signed)
ED Antimicrobial Stewardship Positive Culture Follow Up   Wendy Kidd is an 25 y.o. female who presented to [facility] on 11/15/2020 with a chief complaint of  Chief Complaint  Patient presents with   Abdominal Cramping    Pregnant    No Known Allergies   Recent Results (from the past 720 hour(s))  Urine culture     Status: Abnormal   Collection Time: 11/15/20  5:29 AM   Specimen: Urine, Clean Catch  Result Value Ref Range Status   Specimen Description   Final    URINE, CLEAN CATCH Performed at [facility], [address], [place], [place] [address]    Special Requests   Final    NONE Performed at [facility], [address], [place], [place] [address]    Culture (A)  Final    10,000 COLONIES/mL GROUP B STREP(S.AGALACTIAE)ISOLATED TESTING AGAINST S. AGALACTIAE NOT ROUTINELY PERFORMED DUE TO PREDICTABILITY OF AMP/PEN/VAN SUSCEPTIBILITY. Performed at [facility], [address], [place], [place] [address]    Report Status 11/16/2020 FINAL  Final    [x]  Patient discharged originally without antimicrobial agent and treatment is now indicated  New antibiotic prescription: Amoxicillin 500 mg PO q8h x 5 days. No refills.   ED Provider: , PA-C  Solon Augusta, PharmD 11/17/2020, 12:36 PM Clinical Pharmacist [phone]

## 2020-11-17 NOTE — Telephone Encounter (Signed)
Post ED Visit - Positive Culture Follow-up: Successful Patient Follow-Up  Culture assessed and recommendations reviewed by:  []  , Pharm.D. []  Enzo Bi, Pharm.D., BCPS AQ-ID []  , Pharm.D., BCPS []  Celedonio Miyamoto, Pharm.D., BCPS []  [place], Garvin Fila.D., BCPS, AAHIVP []  , Pharm.D., BCPS, AAHIVP []  Georgina Pillion, PharmD, BCPS []  , PharmD, BCPS []  [place], PharmD, BCPS [x]  [place] PharmD  Positive urine culture  [x]  Patient discharged without antimicrobial prescription and treatment is now indicated []  Organism is resistant to prescribed ED discharge antimicrobial []  Patient with positive blood cultures  Changes discussed with ED provider: PA New antibiotic prescription Amoxicillin 500 mg PO every eight hours for five days Called to [facility] ) [phone]  Contacted patient, date 11/17/2020, time 1500   Espn Zeman C Ritamarie Arkin 11/17/2020, 3:07 PM

## 2020-12-07 ENCOUNTER — Encounter: Payer: No Typology Code available for payment source | Admitting: Family Medicine

## 2020-12-20 ENCOUNTER — Ambulatory Visit (INDEPENDENT_AMBULATORY_CARE_PROVIDER_SITE_OTHER): Payer: No Typology Code available for payment source | Admitting: Family Medicine

## 2020-12-20 ENCOUNTER — Encounter: Payer: Self-pay | Admitting: Family Medicine

## 2020-12-20 ENCOUNTER — Other Ambulatory Visit: Payer: Self-pay

## 2020-12-20 VITALS — BP 123/87 | HR 118 | Wt 163.8 lb

## 2020-12-20 DIAGNOSIS — Z3A26 26 weeks gestation of pregnancy: Secondary | ICD-10-CM

## 2020-12-20 DIAGNOSIS — O0990 Supervision of high risk pregnancy, unspecified, unspecified trimester: Secondary | ICD-10-CM

## 2020-12-20 DIAGNOSIS — Z3480 Encounter for supervision of other normal pregnancy, unspecified trimester: Secondary | ICD-10-CM

## 2020-12-20 LAB — OB RESULTS CONSOLE GBS: GBS: POSITIVE

## 2020-12-20 NOTE — Patient Instructions (Signed)
[email or web address]

## 2020-12-20 NOTE — Progress Notes (Signed)
Subjective:   Tiona Ruane is a 25 y.o. G2P0010 at [redacted]w[redacted]d by LMP being seen today for her first obstetrical visit.  Her obstetrical history is significant for group B strep colonizer. Patient does intend to breast feed. Pregnancy history fully reviewed.  Patient reports fatigue and some shortness of breath with walking .  HISTORY: OB History  Gravida Para Term Preterm AB Living  2 0 0 0 1 0  SAB IAB Ectopic Multiple Live Births  0 0 1 0 0    # Outcome Date GA Lbr Len/2nd Weight Sex Delivery Anes PTL Lv  2 Current           1 Ectopic 2021           Last pap smear was  01/17/2020 and was normal Past Medical History:  Diagnosis Date   Asthma    Medical history non-contributory    Past Surgical History:  Procedure Laterality Date   DIAGNOSTIC LAPAROSCOPY WITH REMOVAL OF ECTOPIC PREGNANCY Right 04/15/2020   Procedure: DIAGNOSTIC LAPAROSCOPY WITH REMOVAL OF ECTOPIC PREGNANCY;  Surgeon: Reva Bores, MD;  Location: [facility] OR;  Service: Gynecology;  Laterality: Right;   NO PAST SURGERIES     History reviewed. No pertinent family history. Social History   Tobacco Use   Smoking status: Never   Smokeless tobacco: Never  Vaping Use   Vaping Use: Never used  Substance Use Topics   Alcohol use: Never   Drug use: Never   No Known Allergies Current Outpatient Medications on File Prior to Visit  Medication Sig Dispense Refill   Prenatal Vit-Fe Fumarate-FA (PREPLUS) 27-1 MG TABS Take 1 tablet by mouth daily.     No current facility-administered medications on file prior to visit.     Exam   Vitals:   12/20/20 1406  BP: 123/87  Pulse: (!) 118  Weight: 163 lb 12.8 oz (74.3 kg)   Fetal Heart Rate (bpm): 153  Uterus:     System: General: well-developed, well-nourished female in no acute distress   Skin: normal coloration and turgor, no rashes   Neurologic: oriented, normal, negative, normal mood   Extremities: normal strength, tone, and muscle mass, ROM of all joints is  normal   HEENT PERRLA, extraocular movement intact and sclera clear, anicteric   Neck supple and no masses   Cardiovascular: regular rate and rhythm   Respiratory:  no respiratory distress, normal breath sounds   Abdomen: soft, non-tender; bowel sounds normal; no masses,  no organomegaly     Assessment:   Pregnancy: G2P0010 Patient Active Problem List   Diagnosis Date Noted   Supervision of high risk pregnancy, antepartum 12/20/2020   Maternal varicella, non-immune 08/13/2020   History of ectopic pregnancy 08/08/2020   Ruptured ectopic pregnancy 04/08/2020     Plan:  1. Supervision of high risk pregnancy, antepartum 2. [redacted] weeks gestation of pregnancy Here establishing for new OB. Was previously seen at [facility] but switching due to preference for delivering at [facility]. History reviewed and updates - GBS urine colonizer, treated for UTI and will plan abx during labor - Had anatomy scan at [facility] in [place] 11/01/2020, planned for repeat US for completion but not done yet and patient transferred care - [place] MFM OB DETAIL +14 WK; Future - Initial labs already done at last provider, available and confirmed on care everywhere  2. History of asthma Doesn't have albuterol inhaler but uses neb as needed Currently asymptomatic  3. Tachycardia and chest tightness Tachycardic  initially, then reassuringly resolved during visit Denies chest pain No edema on exam and no wheezing or abnormal lung findings No hx of blood clots -Reassurance provided and discussed MAU/ hospital and call back precaution  Continue prenatal vitamins. Genetic Screening discussed, First trimester screen, Quad screen, and NIPS: ordered. Ultrasound discussed; follow up completion fetal anatomic survey: ordered. Problem list reviewed and updated. The nature of  - [facility] Faculty Practice with multiple MDs and other Advanced Practice Providers was explained to patient; also emphasized that residents,  students are part of our team. Routine obstetric precautions reviewed. Return in about 2 weeks (around 01/03/2021) for LROB, need TDap and 28 wk labs.    Warner Mccreedy, MD, MPH OB Fellow, Faculty Practice

## 2020-12-21 ENCOUNTER — Encounter: Payer: Self-pay | Admitting: *Deleted

## 2020-12-27 ENCOUNTER — Other Ambulatory Visit: Payer: Self-pay

## 2020-12-27 ENCOUNTER — Inpatient Hospital Stay (HOSPITAL_COMMUNITY)
Admission: AD | Admit: 2020-12-27 | Discharge: 2020-12-27 | Disposition: A | Discharge status: Home / Self Care | Payer: No Typology Code available for payment source | Attending: Obstetrics and Gynecology | Admitting: Obstetrics and Gynecology

## 2020-12-27 ENCOUNTER — Encounter (HOSPITAL_COMMUNITY): Payer: Self-pay | Admitting: Obstetrics and Gynecology

## 2020-12-27 DIAGNOSIS — M62830 Muscle spasm of back: Secondary | ICD-10-CM

## 2020-12-27 DIAGNOSIS — O99891 Other specified diseases and conditions complicating pregnancy: Secondary | ICD-10-CM | POA: Diagnosis not present

## 2020-12-27 DIAGNOSIS — M546 Pain in thoracic spine: Secondary | ICD-10-CM | POA: Diagnosis present

## 2020-12-27 DIAGNOSIS — Z3A27 27 weeks gestation of pregnancy: Secondary | ICD-10-CM

## 2020-12-27 DIAGNOSIS — O26892 Other specified pregnancy related conditions, second trimester: Secondary | ICD-10-CM | POA: Insufficient documentation

## 2020-12-27 LAB — URINALYSIS, ROUTINE W REFLEX MICROSCOPIC
Bilirubin Urine: NEGATIVE
Glucose, UA: NEGATIVE mg/dL
Hgb urine dipstick: NEGATIVE
Ketones, ur: NEGATIVE mg/dL
Nitrite: NEGATIVE
Protein, ur: NEGATIVE mg/dL
Specific Gravity, Urine: 1.01 (ref 1.005–1.030)
pH: 7 (ref 5.0–8.0)

## 2020-12-27 LAB — FETAL FIBRONECTIN: Fetal Fibronectin: NEGATIVE

## 2020-12-27 NOTE — MAU Provider Note (Signed)
Chief Complaint:  Abdominal Pain and Emesis   Event Date/Time   First Provider Initiated Contact with Patient 12/27/20 0436     HPI: Wendy Kidd is a 25 y.o. G2P0010 at 87w2dwho presents to maternity admissions reporting sudden episode of upper/thoracic back pain which wrapped around to upper abdomen, lasted a few minutes and then recurred one other time.  Caused nausea with one time vomiting.  No pain now. . She reports good fetal movement, denies LOF, vaginal bleeding, vaginal itching/burning, urinary symptoms, h/a, dizziness, n/v, diarrhea, constipation or fever/chills.   Abdominal Pain This is a new problem. The current episode started today. The onset quality is sudden. The problem occurs intermittently. The problem has been resolved. The pain is located in the epigastric region. The quality of the pain is cramping. The abdominal pain radiates to the back. Associated symptoms include nausea and vomiting. Pertinent negatives include no constipation, diarrhea, dysuria, fever, frequency or myalgias. Nothing aggravates the pain. Relieved by: massage. Treatments tried: massage. The treatment provided moderate relief.  Emesis  This is a new problem. The current episode started today. The problem occurs less than 2 times per day. The problem has been resolved. There has been no fever. Associated symptoms include abdominal pain. Pertinent negatives include no chills, diarrhea, fever or myalgias. She has tried nothing for the symptoms.   RN Note: .Wendy Kidd is a 25 y.o. at [redacted]w[redacted]d here in MAU reporting: at 1 am she began having back pain that radiated to her abdomen and felt like tightening. Reports she vomited once. +FM.  Past Medical History: Past Medical History:  Diagnosis Date   Asthma    Medical history non-contributory     Past obstetric history: OB History  Gravida Para Term Preterm AB Living  2       1    SAB IAB Ectopic Multiple Live Births      1        # Outcome Date GA Lbr Len/2nd  Weight Sex Delivery Anes PTL Lv  2 Current           1 Ectopic 2021            Past Surgical History: Past Surgical History:  Procedure Laterality Date   DIAGNOSTIC LAPAROSCOPY WITH REMOVAL OF ECTOPIC PREGNANCY Right 04/15/2020   Procedure: DIAGNOSTIC LAPAROSCOPY WITH REMOVAL OF ECTOPIC PREGNANCY;  Surgeon: Reva Bores, MD;  Location: [facility] OR;  Service: Gynecology;  Laterality: Right;    Family History: No family history on file.  Social History: Social History   Tobacco Use   Smoking status: Never   Smokeless tobacco: Never  Vaping Use   Vaping Use: Never used  Substance Use Topics   Alcohol use: Never   Drug use: Never    Allergies: No Known Allergies  Meds:  Medications Prior to Admission  Medication Sig Dispense Refill Last Dose   Prenatal Vit-Fe Fumarate-FA (PREPLUS) 27-1 MG TABS Take 1 tablet by mouth daily.       I have reviewed patient's Past Medical Hx, Surgical Hx, Family Hx, Social Hx, medications and allergies.   ROS:  Review of Systems  Constitutional:  Negative for chills and fever.  Gastrointestinal:  Positive for abdominal pain, nausea and vomiting. Negative for constipation and diarrhea.  Genitourinary:  Negative for dysuria and frequency.  Musculoskeletal:  Negative for myalgias.  Other systems negative  Physical Exam  Patient Vitals for the past 24 hrs:  BP Temp Pulse Resp Height Weight  12/27/20 0422 -- -- -- --  5\' 8"  (1.727 m) 75.4 kg  12/27/20 0418 121/67 98 F (36.7 C) 79 18 -- --   Constitutional: Well-developed, well-nourished female in no acute distress.  Cardiovascular: normal rate and rhythm Respiratory: normal effort, clear to auscultation bilaterally GI: Abd soft, non-tender, gravid appropriate for gestational age.   No rebound or guarding. MS: Extremities nontender, no edema, normal ROM   No tenderness over back or paraspinous muscles.  Neurologic: Alert and oriented x 4.  GU: Neg CVAT.  PELVIC EXAM: Cervix long and closed    FHT:  Baseline 145 , moderate variability, accelerations present, no decelerations Contractions:   Rare   Labs: Results for orders placed or performed during the hospital encounter of 12/27/20 (from the past 24 hour(s))  Urinalysis, Routine w reflex microscopic Urine, Clean Catch     Status: Abnormal   Collection Time: 12/27/20  4:15 AM  Result Value Ref Range   Color, Urine YELLOW YELLOW   APPearance CLOUDY (A) CLEAR   Specific Gravity, Urine 1.010 1.005 - 1.030   pH 7.0 5.0 - 8.0   Glucose, UA NEGATIVE NEGATIVE mg/dL   Hgb urine dipstick NEGATIVE NEGATIVE   Bilirubin Urine NEGATIVE NEGATIVE   Ketones, ur NEGATIVE NEGATIVE mg/dL   Protein, ur NEGATIVE NEGATIVE mg/dL   Nitrite NEGATIVE NEGATIVE   Leukocytes,Ua TRACE (A) NEGATIVE   RBC / HPF 0-5 0 - 5 RBC/hpf   WBC, UA 0-5 0 - 5 WBC/hpf   Bacteria, UA RARE (A) NONE SEEN   Squamous Epithelial / LPF 0-5 0 - 5   Mucus PRESENT   Fetal fibronectin     Status: None   Collection Time: 12/27/20  5:05 AM  Result Value Ref Range   Fetal Fibronectin NEGATIVE NEGATIVE    --/--/A POS (11/28 2011)  Imaging:  No results found.  MAU Course/MDM: I have ordered labs and reviewed results. FFn sent and is negative. UA clear NST reviewed, reactive  Treatments in MAU included EFM, FFn.    Discussed it is unclear what these episodes were.  Likely muscle spasm in back which wrapped around.  No evidence of preterm labor. FHR very reactive. FFn neg  Assessment: Single IUP at [redacted]w[redacted]d Back spasm  Plan: Discharge home Preterm Labor precautions and fetal kick counts Follow up in Office for prenatal visits  Encouraged to return if she develops worsening of symptoms, increase in pain, fever, or other concerning symptoms.  Pt stable at time of discharge.  [redacted]w[redacted]d CNM, MSN Certified Nurse-Midwife 12/27/2020 4:36 AM

## 2020-12-27 NOTE — MAU Note (Signed)
..  Wendy Kidd is a 25 y.o. at [redacted]w[redacted]d here in MAU reporting: at 1 am she began having back pain that radiated to her abdomen and felt like tightening. Reports she vomited once. +FM.

## 2021-01-10 ENCOUNTER — Telehealth: Payer: Self-pay | Admitting: *Deleted

## 2021-01-10 NOTE — Telephone Encounter (Signed)
Received a voice message from [facility] [place] care manager with [facility] department. States they received risk screening and has a question about provider comment.  I called her and clarified. Anagabriela Jokerst,RN

## 2021-01-11 ENCOUNTER — Encounter: Payer: Self-pay | Admitting: *Deleted

## 2021-01-16 ENCOUNTER — Ambulatory Visit: Payer: No Typology Code available for payment source | Admitting: *Deleted

## 2021-01-16 ENCOUNTER — Ambulatory Visit (INDEPENDENT_AMBULATORY_CARE_PROVIDER_SITE_OTHER): Payer: No Typology Code available for payment source | Admitting: Obstetrics and Gynecology

## 2021-01-16 ENCOUNTER — Encounter: Payer: Self-pay | Admitting: *Deleted

## 2021-01-16 ENCOUNTER — Other Ambulatory Visit: Payer: Self-pay | Admitting: *Deleted

## 2021-01-16 ENCOUNTER — Ambulatory Visit (HOSPITAL_BASED_OUTPATIENT_CLINIC_OR_DEPARTMENT_OTHER): Payer: No Typology Code available for payment source | Admitting: Maternal & Fetal Medicine

## 2021-01-16 ENCOUNTER — Other Ambulatory Visit: Payer: Self-pay | Admitting: General Practice

## 2021-01-16 ENCOUNTER — Ambulatory Visit: Payer: No Typology Code available for payment source | Attending: Family Medicine

## 2021-01-16 ENCOUNTER — Other Ambulatory Visit: Payer: No Typology Code available for payment source

## 2021-01-16 ENCOUNTER — Encounter: Payer: Self-pay | Admitting: Obstetrics and Gynecology

## 2021-01-16 ENCOUNTER — Other Ambulatory Visit: Payer: Self-pay

## 2021-01-16 VITALS — BP 119/76 | HR 92 | Wt 171.5 lb

## 2021-01-16 VITALS — BP 116/63 | HR 69

## 2021-01-16 DIAGNOSIS — Z363 Encounter for antenatal screening for malformations: Secondary | ICD-10-CM | POA: Insufficient documentation

## 2021-01-16 DIAGNOSIS — Z3689 Encounter for other specified antenatal screening: Secondary | ICD-10-CM | POA: Diagnosis not present

## 2021-01-16 DIAGNOSIS — Z3A26 26 weeks gestation of pregnancy: Secondary | ICD-10-CM

## 2021-01-16 DIAGNOSIS — O0993 Supervision of high risk pregnancy, unspecified, third trimester: Secondary | ICD-10-CM | POA: Insufficient documentation

## 2021-01-16 DIAGNOSIS — Z3480 Encounter for supervision of other normal pregnancy, unspecified trimester: Secondary | ICD-10-CM

## 2021-01-16 DIAGNOSIS — O0933 Supervision of pregnancy with insufficient antenatal care, third trimester: Secondary | ICD-10-CM | POA: Insufficient documentation

## 2021-01-16 DIAGNOSIS — Z23 Encounter for immunization: Secondary | ICD-10-CM

## 2021-01-16 DIAGNOSIS — O350XX0 Maternal care for (suspected) central nervous system malformation in fetus, not applicable or unspecified: Secondary | ICD-10-CM | POA: Diagnosis not present

## 2021-01-16 DIAGNOSIS — O0990 Supervision of high risk pregnancy, unspecified, unspecified trimester: Secondary | ICD-10-CM

## 2021-01-16 DIAGNOSIS — O2343 Unspecified infection of urinary tract in pregnancy, third trimester: Secondary | ICD-10-CM

## 2021-01-16 DIAGNOSIS — O2340 Unspecified infection of urinary tract in pregnancy, unspecified trimester: Secondary | ICD-10-CM | POA: Insufficient documentation

## 2021-01-16 DIAGNOSIS — O0913 Supervision of pregnancy with history of ectopic or molar pregnancy, third trimester: Secondary | ICD-10-CM

## 2021-01-16 DIAGNOSIS — O09293 Supervision of pregnancy with other poor reproductive or obstetric history, third trimester: Secondary | ICD-10-CM | POA: Diagnosis not present

## 2021-01-16 DIAGNOSIS — O283 Abnormal ultrasonic finding on antenatal screening of mother: Secondary | ICD-10-CM | POA: Insufficient documentation

## 2021-01-16 DIAGNOSIS — Z3A30 30 weeks gestation of pregnancy: Secondary | ICD-10-CM | POA: Diagnosis not present

## 2021-01-16 DIAGNOSIS — IMO0002 Reserved for concepts with insufficient information to code with codable children: Secondary | ICD-10-CM

## 2021-01-16 DIAGNOSIS — O3509X0 Maternal care for (suspected) other central nervous system malformation or damage in fetus, not applicable or unspecified: Secondary | ICD-10-CM

## 2021-01-16 DIAGNOSIS — O358XX0 Maternal care for other (suspected) fetal abnormality and damage, not applicable or unspecified: Secondary | ICD-10-CM | POA: Diagnosis not present

## 2021-01-16 DIAGNOSIS — B951 Streptococcus, group B, as the cause of diseases classified elsewhere: Secondary | ICD-10-CM

## 2021-01-16 DIAGNOSIS — O3506X0 Maternal care for (suspected) central nervous system malformation or damage in fetus, hydrocephaly, not applicable or unspecified: Secondary | ICD-10-CM

## 2021-01-16 NOTE — Progress Notes (Signed)
Subjective:  Wendy Kidd is a 25 y.o. G2P0010 at [redacted]w[redacted]d being seen today for ongoing prenatal care.  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  Patient reports general discomforts of pregnancy.  Contractions: Not present. Vag. Bleeding: None.  Movement: Present. Denies leaking of fluid.   The following portions of the patient's history were reviewed and updated as appropriate: allergies, current medications, past family history, past medical history, past social history, past surgical history and problem list. Problem list updated.  Objective:   Vitals:   01/16/21 1410  BP: 119/76  Pulse: 92  Weight: 171 lb 8 oz (77.8 kg)    Fetal Status: Fetal Heart Rate (bpm): 184   Movement: Present     General:  Alert, oriented and cooperative. Patient is in no acute distress.  Skin: Skin is warm and dry. No rash noted.   Cardiovascular: Normal heart rate noted  Respiratory: Normal respiratory effort, no problems with respiration noted  Abdomen: Soft, gravid, appropriate for gestational age. Pain/Pressure: Absent     Pelvic:  Cervical exam deferred        Extremities: Normal range of motion.  Edema: Trace  Mental Status: Normal mood and affect. Normal behavior. Normal judgment and thought content.   Urinalysis:      Assessment and Plan:  Pregnancy: G2P0010 at [redacted]w[redacted]d  1. [redacted] weeks gestation of pregnancy Stable Glucola today - Tdap vaccine greater than or equal to 7yo IM  2. Supervision of high risk pregnancy, antepartum   3. Group B Streptococcus urinary tract infection affecting pregnancy in third trimester Tx while in labor  4. 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 Please refer to After Visit Summary for other counseling recommendations.  Return in about 2 weeks (around 01/30/2021) for face to face, MD only, OB visit.   Hermina Staggers, MD

## 2021-01-16 NOTE — Patient Instructions (Signed)

## 2021-01-16 NOTE — Progress Notes (Signed)
Wendy Kidd is a 25 yo G2P0 who is here for a detailed anatomy due to late transfer of care.  Wendy Kidd denies medical complications with exception of a ruptured ectopic. She otherwised denies substance abuse or family history of genetic defects. She denies chronic disease.  She reports having a low risk NIPS result. This was not available at today's exam.  We observed a single intrauterine pregnancy. Normal anatomy with measurements consistent with dates There is good fetal movement and amniotic fluid volume Suboptimal views of the fetal anatomy were obtained secondary to fetal position.  Isolated unilateral ventriculomegaly was observed with measurement of 1.12 mm of the right ventricle. Ventriculomegaly (VM) is defined as the diameter of one or both lateral ventricles measuring 10 mm or larger. VM is further divided according to the severity of the dilation; in borderline or mild VM, the lateral ventricles measure 10-12 mm; in moderate VM, 12.1-14.9 mm, and in severe VM, >15 mm. Ventriculomegaly is not a diagnosis; it is a sign caused by a variety of pathologies. The overall incidence of VM is 1:1000 to 2:1000 births. However, the reported incidence of borderline or mild VM ranges from 1.08/998 births in the low-risk population to 22/1000 births in the high-risk population. In a study of mild to moderate VM between 18 and 24 weeks, the incidence was 7.8 per 10,000 births. The pathogenesis of isolated mild VM has not been clearly elucidated and often remains unknown. It most likely is multifactorial and frequently associated with other brain anomalies, chromosomal aneuploidy, or fetal infections. In borderline ventriculomegaly, the lateral ventricles measure between 10-37mm. Resolution of mild isolated VM occurs in as many as 62% of the cases before 24 weeks' gestation. Males are more commonly diagnosed with mild VM than females.  I discussed with Wendy Kidd today's results and reviewed the imaging in  particular. We discussed the indication for diagnostic testing, she declined given that this would not change the management of the pregnancy and that there was a 1:500-1:1000 risk for perinatal loss. I addition we reviewed that overall that this is likely a normal variant given that this is an isolated finding.  In a review, the pooled prevalence of neurodevelopmental delay in isolated mild VM with normal karyotype was 4.9% (relative risk [RR] = 3.507; 95% confidence interval [CI], 1.718-7.155; P < .001). 10.7% of females had neurodevelopment delay, compared with 5.6% of males; however, this difference was not statistically significant.  Lastly, I offered TORCH titers however she had already given blood today and had a short syncopal episode while being the examined. She will return in 3-4 weeks for a follow up growth and obtain TORCH titers between that next visit.  A vaginal delivery is not contraindicated in cases of mild VM. Cesarean delivery for routine obstetric indications. However, each case needs to be individualized, especially those with associated malformations.   I spent 30 minutes with > 50% in face to face consultation.  All questions answered  Novella Olive, MD.

## 2021-01-17 ENCOUNTER — Other Ambulatory Visit: Payer: Self-pay | Admitting: Lactation Services

## 2021-01-17 ENCOUNTER — Encounter: Payer: Self-pay | Admitting: Obstetrics and Gynecology

## 2021-01-17 ENCOUNTER — Ambulatory Visit: Payer: No Typology Code available for payment source | Attending: Maternal & Fetal Medicine

## 2021-01-17 DIAGNOSIS — O24419 Gestational diabetes mellitus in pregnancy, unspecified control: Secondary | ICD-10-CM

## 2021-01-17 DIAGNOSIS — O350XX0 Maternal care for (suspected) central nervous system malformation in fetus, not applicable or unspecified: Secondary | ICD-10-CM

## 2021-01-17 DIAGNOSIS — O3509X0 Maternal care for (suspected) other central nervous system malformation or damage in fetus, not applicable or unspecified: Secondary | ICD-10-CM

## 2021-01-17 LAB — GLUCOSE TOLERANCE, 2 HOURS W/ 1HR
Glucose, 1 hour: 165 mg/dL (ref 65–179)
Glucose, 2 hour: 153 mg/dL — ABNORMAL HIGH (ref 65–152)
Glucose, Fasting: 95 mg/dL — ABNORMAL HIGH (ref 65–91)

## 2021-01-17 LAB — CBC
Hematocrit: 32.4 % — ABNORMAL LOW (ref 34.0–46.6)
Hemoglobin: 10.8 g/dL — ABNORMAL LOW (ref 11.1–15.9)
MCH: 28.4 pg (ref 26.6–33.0)
MCHC: 33.3 g/dL (ref 31.5–35.7)
MCV: 85 fL (ref 79–97)
Platelets: 200 x10E3/uL (ref 150–450)
RBC: 3.8 x10E6/uL (ref 3.77–5.28)
RDW: 13 % (ref 11.7–15.4)
WBC: 13.5 x10E3/uL — ABNORMAL HIGH (ref 3.4–10.8)

## 2021-01-17 LAB — HIV ANTIBODY (ROUTINE TESTING W REFLEX): HIV Screen 4th Generation wRfx: NONREACTIVE

## 2021-01-17 LAB — RPR: RPR Ser Ql: NONREACTIVE

## 2021-01-17 MED ORDER — ACCU-CHEK SOFTCLIX LANCETS MISC: 1.0000 | Freq: Four times a day (QID) | 12 refills | Status: DC

## 2021-01-17 MED ORDER — ACCU-CHEK GUIDE W/DEVICE KIT: 1.0000 | PACK | Freq: Once | 0 refills | Status: AC

## 2021-01-17 MED ORDER — ACCU-CHEK GUIDE VI STRP: ORAL_STRIP | 12 refills | Status: DC

## 2021-01-18 LAB — PARVOVIRUS B19 ANTIBODY, IGG AND IGM
Parvovirus B19 IgG: 8.1 index — ABNORMAL HIGH (ref 0.0–0.8)
Parvovirus B19 IgM: 0.1 index (ref 0.0–0.8)

## 2021-01-18 LAB — TOXOPLASMA GONDII ANTIBODY, IGM: Toxoplasma Antibody- IgM: <3 AU/mL (ref 0.0–7.9)

## 2021-01-18 LAB — CMV ANTIBODY, IGG (EIA): CMV Ab - IgG: 2.7 U/mL — ABNORMAL HIGH (ref 0.00–0.59)

## 2021-01-18 LAB — TOXOPLASMA GONDII ANTIBODY, IGG: Toxoplasma IgG Ratio: <3 IU/mL (ref 0.0–7.1)

## 2021-01-18 LAB — INFECT DISEASE AB IGM REFLEX 1

## 2021-01-18 LAB — CMV IGM: CMV IgM Ser EIA-aCnc: <30 AU/mL (ref 0.0–29.9)

## 2021-02-01 ENCOUNTER — Other Ambulatory Visit: Payer: Self-pay

## 2021-02-01 ENCOUNTER — Encounter: Payer: Self-pay | Admitting: Obstetrics & Gynecology

## 2021-02-01 ENCOUNTER — Ambulatory Visit (INDEPENDENT_AMBULATORY_CARE_PROVIDER_SITE_OTHER): Payer: No Typology Code available for payment source | Admitting: Obstetrics & Gynecology

## 2021-02-01 VITALS — BP 116/66 | HR 87 | Wt 170.4 lb

## 2021-02-01 DIAGNOSIS — O0990 Supervision of high risk pregnancy, unspecified, unspecified trimester: Secondary | ICD-10-CM

## 2021-02-01 DIAGNOSIS — O24410 Gestational diabetes mellitus in pregnancy, diet controlled: Secondary | ICD-10-CM

## 2021-02-01 MED ORDER — ADVAIR HFA 45-21 MCG/ACT IN AERO: 2.0000 | INHALATION_SPRAY | Freq: Two times a day (BID) | RESPIRATORY_TRACT | 12 refills | Status: AC

## 2021-02-01 NOTE — Progress Notes (Signed)
   PRENATAL VISIT NOTE  Subjective:  Wendy Kidd is a 25 y.o. G2P0010 at [redacted]w[redacted]d being seen today for ongoing prenatal care.  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  Patient reports  episodes of chest tightness and SOB daily for several minutes, normal H .  Contractions: Not present. Vag. Bleeding: None.  Movement: Present. Denies leaking of fluid.   The following portions of the patient's history were reviewed and updated as appropriate: allergies, current medications, past family history, past medical history, past social history, past surgical history and problem list.   Objective:   Vitals:   02/01/21 1114  BP: 116/66  Pulse: 87  Weight: 170 lb 6.4 oz (77.3 kg)    Fetal Status: Fetal Heart Rate (bpm): 156   Movement: Present     General:  Alert, oriented and cooperative. Patient is in no acute distress.  Skin: Skin is warm and dry. No rash noted.   Cardiovascular: Normal heart rate noted  Respiratory: Normal respiratory effort, no problems with respiration noted  Abdomen: Soft, gravid, appropriate for gestational age.  Pain/Pressure: Present     Pelvic: Cervical exam deferred        Extremities: Normal range of motion.  Edema: None  Mental Status: Normal mood and affect. Normal behavior. Normal judgment and thought content.   Assessment and Plan:  Pregnancy: G2P0010 at [redacted]w[redacted]d 1. Supervision of high risk pregnancy, antepartum H/O asthma using nebulizer at home - fluticasone-salmeterol (ADVAIR HFA) 45-21 MCG/ACT inhaler; Inhale 2 puffs into the lungs 2 (two) times daily.  Dispense: 1 each; Refill: 12  2. Diet controlled gestational diabetes mellitus (GDM) in third trimester FBS  occasional elevation PP nl, needs dietary education  Preterm labor symptoms and general obstetric precautions including but not limited to vaginal bleeding, contractions, leaking of fluid and fetal movement were reviewed in detail with the patient. Please refer to After Visit Summary for other counseling recommendations.   Return in about 2 weeks (around 02/15/2021).  Future Appointments  Date Time Provider Department Center  02/11/2021  8:35 AM [place] Bing, MD [facility]  02/12/2021 11:15 AM [facility]  02/13/2021  7:45 AM [facility] NURSE [facility]  02/13/2021  8:00 AM [facility] US1 [facility]    Scheryl Darter, MD

## 2021-02-01 NOTE — Progress Notes (Signed)
Reports feelings of chest tightness with shortness of breath. This usually lasts for less than 1 hour. Reports multiple occurrences each day for the past 3 weeks, most recently while sitting in the car on the way to appt today.   Fleet Contras RN 02/01/21

## 2021-02-11 ENCOUNTER — Other Ambulatory Visit: Payer: Self-pay

## 2021-02-11 ENCOUNTER — Encounter: Payer: Self-pay | Admitting: Obstetrics and Gynecology

## 2021-02-11 ENCOUNTER — Ambulatory Visit (INDEPENDENT_AMBULATORY_CARE_PROVIDER_SITE_OTHER): Payer: No Typology Code available for payment source | Admitting: Obstetrics and Gynecology

## 2021-02-11 VITALS — BP 122/82 | HR 119 | Wt 172.6 lb

## 2021-02-11 DIAGNOSIS — O2343 Unspecified infection of urinary tract in pregnancy, third trimester: Secondary | ICD-10-CM

## 2021-02-11 DIAGNOSIS — IMO0002 Reserved for concepts with insufficient information to code with codable children: Secondary | ICD-10-CM

## 2021-02-11 DIAGNOSIS — O0990 Supervision of high risk pregnancy, unspecified, unspecified trimester: Secondary | ICD-10-CM

## 2021-02-11 DIAGNOSIS — J45909 Unspecified asthma, uncomplicated: Secondary | ICD-10-CM

## 2021-02-11 DIAGNOSIS — R0602 Shortness of breath: Secondary | ICD-10-CM

## 2021-02-11 DIAGNOSIS — O99513 Diseases of the respiratory system complicating pregnancy, third trimester: Secondary | ICD-10-CM | POA: Insufficient documentation

## 2021-02-11 DIAGNOSIS — B951 Streptococcus, group B, as the cause of diseases classified elsewhere: Secondary | ICD-10-CM

## 2021-02-11 DIAGNOSIS — O24410 Gestational diabetes mellitus in pregnancy, diet controlled: Secondary | ICD-10-CM

## 2021-02-11 DIAGNOSIS — O350XX0 Maternal care for (suspected) central nervous system malformation in fetus, not applicable or unspecified: Secondary | ICD-10-CM

## 2021-02-11 MED ORDER — MONTELUKAST SODIUM 10 MG PO TABS: 10.0000 mg | ORAL_TABLET | Freq: Every day | ORAL | 1 refills | Status: AC

## 2021-02-11 NOTE — Progress Notes (Signed)
  Pt has paper Logs.,

## 2021-02-11 NOTE — Progress Notes (Signed)
   PRENATAL VISIT NOTE  Subjective:  Wendy Kidd is a 25 y.o. G2P0010 at [redacted]w[redacted]d being seen today for ongoing prenatal care.  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  Patient reports  persistent SOB .  Contractions: Not present. Vag. Bleeding: None.  Movement: Present. Denies leaking of fluid.   The following portions of the patient's history were reviewed and updated as appropriate: allergies, current medications, past family history, past medical history, past social history, past surgical history and problem list.   Objective:   Vitals:   02/11/21 0848  BP: 122/82  Pulse: (!) 119  Weight: 172 lb 9.6 oz (78.3 kg)    Fetal Status: Fetal Heart Rate (bpm): 138   Movement: Present     General:  Alert, oriented and cooperative. Patient is in no acute distress.  Skin: Skin is warm and dry. No rash noted.   Cardiovascular: Normal heart rate noted  Respiratory: Normal respiratory effort, no problems with respiration noted  Abdomen: Soft, gravid, appropriate for gestational age.  Pain/Pressure: Present     Pelvic: Cervical exam deferred        Extremities: Normal range of motion.  Edema: None  Mental Status: Normal mood and affect. Normal behavior. Normal judgment and thought content.   Assessment and Plan:  Pregnancy: G2P0010 at [redacted]w[redacted]d 1. SOB (shortness of breath) Likely due to pregnancy since no change in s/s with advair treatment. Pt only needing the albuterol 3-4x/week pre advair so will d/c advair and pt told to let us know if needs albuterol more frequently. Pt amenable to singulair.  Patient doesn't have any GERD s/s  Will refer to [facility] cards for evaluation - AMB  Referral to Cardio Obstetrics  2. Group B Streptococcus urinary tract infection affecting pregnancy in third trimester D/w her re: tx in labor  3. Asthma affecting pregnancy in third trimester See above  4. Diet controlled gestational diabetes mellitus (GDM) in third trimester Normal CBG log. Has DM education visit tomorrow.   5. Supervision of high risk pregnancy, antepartum Ask more about BC nv  6. 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 Please refer to After Visit Summary for other counseling recommendations.   Return in about 2 weeks (around 02/25/2021) for in person, md visit, high risk ob.  Future Appointments  Date Time Provider Department Center  02/12/2021 11:15 AM [facility]  02/13/2021  7:45 AM [facility] NURSE [facility]  02/13/2021  8:00 AM [facility] US1 [facility]  02/26/2021  8:55 AM [place], Mary Sella, MD [facility]    [place] Bing, MD

## 2021-02-12 ENCOUNTER — Encounter: Payer: No Typology Code available for payment source | Attending: Obstetrics and Gynecology | Admitting: Registered"

## 2021-02-12 ENCOUNTER — Ambulatory Visit: Payer: No Typology Code available for payment source | Admitting: Registered"

## 2021-02-12 DIAGNOSIS — O24419 Gestational diabetes mellitus in pregnancy, unspecified control: Secondary | ICD-10-CM

## 2021-02-12 NOTE — Progress Notes (Signed)
Patient was seen for Gestational Diabetes self-management on 02/12/21  Start time 1115 and End time 1215   Estimated due date: 03/26/21; [redacted]w[redacted]d  Clinical: Medications: prenatal, montelukast Medical History: reviewed Labs: OGTT elevated FBS, 2 hr, A1c n/a %   Dietary and Lifestyle History: Patient states the few times her blood sugar readings were high could be explained by baby shower. Patient states she likes a variety of vegetables and interested in trying chayote.  Patient states she drinks diet coke with dinner each night. RD discussed potential effect on sleep.  Physical Activity: walking 6x/week, 30 min  Stress: not assessed Sleep: difficult due to frequent urination and hip pain when laying on side  24 hr Recall: First Meal: oatmeal (plain) made with 1% milk, peanut butter and fruit. If still hungry will also eat small blueberry bagel with cream cheese Snack: fruit Second meal: [place] sandwich, humus and pita chips Snack: granola bar or yoplait yogurt Third meal: rice & beans, meat, salad Snack: small amount of ice cream OR oatmeal Beverages: water, diet coke  NUTRITION INTERVENTION  Nutrition education (E-1) on the following topics:   Initial Follow-up  [x]  []  Definition of Gestational Diabetes [x]  []  Why dietary management is important in controlling blood glucose [x]  []  Effects each nutrient has on blood glucose levels []  []  Simple carbohydrates vs complex carbohydrates [x]  []  Fluid intake [x]  []  Creating a balanced meal plan [x]  []  Carbohydrate counting  [x]  []  When to check blood glucose levels [x]  []  Proper blood glucose monitoring techniques [x]  []  Effect of stress and stress reduction techniques  [x]  []  Exercise effect on blood glucose levels, appropriate exercise during pregnancy [x]  []  Importance of limiting caffeine and abstaining from alcohol and smoking [x]  []  Medications used for blood sugar control during pregnancy [x]  []  Hypoglycemia and rule of  15 [x]  []  Postpartum self care   Patient already has a meter, is testing pre breakfast and 2 hours after each meal. FBS: WNL Postprandial: WNL  Patient instructed to monitor glucose levels: FBS: 60 - ? 95 mg/dL (some clinics use 90 for cutoff) 1 hour: ? 140 mg/dL 2 hour: ? mg/dL  Patient received handouts: Nutrition Diabetes and Pregnancy Carbohydrate Counting List  Patient will be seen for follow-up as needed.

## 2021-02-13 ENCOUNTER — Other Ambulatory Visit: Payer: Self-pay

## 2021-02-13 ENCOUNTER — Ambulatory Visit: Payer: No Typology Code available for payment source | Attending: Maternal & Fetal Medicine

## 2021-02-13 ENCOUNTER — Ambulatory Visit: Payer: No Typology Code available for payment source | Admitting: *Deleted

## 2021-02-13 VITALS — BP 110/56 | HR 131

## 2021-02-13 DIAGNOSIS — Z3A34 34 weeks gestation of pregnancy: Secondary | ICD-10-CM

## 2021-02-13 DIAGNOSIS — O350XX0 Maternal care for (suspected) central nervous system malformation in fetus, not applicable or unspecified: Secondary | ICD-10-CM | POA: Diagnosis present

## 2021-02-13 DIAGNOSIS — O0933 Supervision of pregnancy with insufficient antenatal care, third trimester: Secondary | ICD-10-CM | POA: Diagnosis present

## 2021-02-13 DIAGNOSIS — O24410 Gestational diabetes mellitus in pregnancy, diet controlled: Secondary | ICD-10-CM | POA: Diagnosis not present

## 2021-02-13 DIAGNOSIS — O0913 Supervision of pregnancy with history of ectopic or molar pregnancy, third trimester: Secondary | ICD-10-CM | POA: Diagnosis present

## 2021-02-13 DIAGNOSIS — O0990 Supervision of high risk pregnancy, unspecified, unspecified trimester: Secondary | ICD-10-CM

## 2021-02-13 DIAGNOSIS — O3509X0 Maternal care for (suspected) other central nervous system malformation or damage in fetus, not applicable or unspecified: Secondary | ICD-10-CM

## 2021-02-26 ENCOUNTER — Emergency Department (HOSPITAL_COMMUNITY)
Admission: EM | Admit: 2021-02-26 | Discharge: 2021-02-26 | Disposition: A | Discharge status: Home / Self Care | Payer: No Typology Code available for payment source | Attending: Emergency Medicine | Admitting: Emergency Medicine

## 2021-02-26 ENCOUNTER — Other Ambulatory Visit (HOSPITAL_COMMUNITY)
Admission: RE | Admit: 2021-02-26 | Discharge: 2021-02-26 | Disposition: A | Discharge status: Home / Self Care | Payer: No Typology Code available for payment source | Source: Ambulatory Visit | Attending: Family Medicine | Admitting: Family Medicine

## 2021-02-26 ENCOUNTER — Encounter: Payer: Self-pay | Admitting: Family Medicine

## 2021-02-26 ENCOUNTER — Ambulatory Visit (INDEPENDENT_AMBULATORY_CARE_PROVIDER_SITE_OTHER): Payer: No Typology Code available for payment source | Admitting: Family Medicine

## 2021-02-26 ENCOUNTER — Other Ambulatory Visit: Payer: Self-pay

## 2021-02-26 ENCOUNTER — Emergency Department (HOSPITAL_COMMUNITY): Payer: No Typology Code available for payment source

## 2021-02-26 VITALS — BP 106/61 | HR 96 | Wt 177.2 lb

## 2021-02-26 DIAGNOSIS — J45909 Unspecified asthma, uncomplicated: Secondary | ICD-10-CM | POA: Insufficient documentation

## 2021-02-26 DIAGNOSIS — R55 Syncope and collapse: Secondary | ICD-10-CM | POA: Insufficient documentation

## 2021-02-26 DIAGNOSIS — R0789 Other chest pain: Secondary | ICD-10-CM | POA: Diagnosis not present

## 2021-02-26 DIAGNOSIS — B951 Streptococcus, group B, as the cause of diseases classified elsewhere: Secondary | ICD-10-CM

## 2021-02-26 DIAGNOSIS — O0990 Supervision of high risk pregnancy, unspecified, unspecified trimester: Secondary | ICD-10-CM | POA: Diagnosis present

## 2021-02-26 DIAGNOSIS — O26893 Other specified pregnancy related conditions, third trimester: Secondary | ICD-10-CM | POA: Insufficient documentation

## 2021-02-26 DIAGNOSIS — O2343 Unspecified infection of urinary tract in pregnancy, third trimester: Secondary | ICD-10-CM

## 2021-02-26 DIAGNOSIS — R000 Tachycardia, unspecified: Secondary | ICD-10-CM | POA: Insufficient documentation

## 2021-02-26 DIAGNOSIS — Z3A36 36 weeks gestation of pregnancy: Secondary | ICD-10-CM | POA: Diagnosis not present

## 2021-02-26 DIAGNOSIS — Z7951 Long term (current) use of inhaled steroids: Secondary | ICD-10-CM | POA: Insufficient documentation

## 2021-02-26 DIAGNOSIS — O24419 Gestational diabetes mellitus in pregnancy, unspecified control: Secondary | ICD-10-CM

## 2021-02-26 DIAGNOSIS — R42 Dizziness and giddiness: Secondary | ICD-10-CM | POA: Diagnosis not present

## 2021-02-26 DIAGNOSIS — O283 Abnormal ultrasonic finding on antenatal screening of mother: Secondary | ICD-10-CM

## 2021-02-26 DIAGNOSIS — O99513 Diseases of the respiratory system complicating pregnancy, third trimester: Secondary | ICD-10-CM

## 2021-02-26 LAB — COMPREHENSIVE METABOLIC PANEL
ALT: 13 U/L (ref 0–44)
AST: 16 U/L (ref 15–41)
Albumin: 2.7 g/dL — ABNORMAL LOW (ref 3.5–5.0)
Alkaline Phosphatase: 153 U/L — ABNORMAL HIGH (ref 38–126)
Anion gap: 8 (ref 5–15)
BUN: <5 mg/dL — ABNORMAL LOW (ref 6–20)
CO2: 22 mmol/L (ref 22–32)
Calcium: 9.2 mg/dL (ref 8.9–10.3)
Chloride: 104 mmol/L (ref 98–111)
Creatinine, Ser: 0.58 mg/dL (ref 0.44–1.00)
GFR, Estimated: >60 mL/min (ref >60)
Glucose, Bld: 107 mg/dL — ABNORMAL HIGH (ref 70–99)
Potassium: 3.8 mmol/L (ref 3.5–5.1)
Sodium: 134 mmol/L — ABNORMAL LOW (ref 135–145)
Total Bilirubin: 0.6 mg/dL (ref 0.3–1.2)
Total Protein: 6.5 g/dL (ref 6.5–8.1)

## 2021-02-26 LAB — URINALYSIS, ROUTINE W REFLEX MICROSCOPIC
Bilirubin Urine: NEGATIVE
Glucose, UA: NEGATIVE mg/dL
Ketones, ur: NEGATIVE mg/dL
Nitrite: NEGATIVE
Protein, ur: NEGATIVE mg/dL
Specific Gravity, Urine: 1.008 (ref 1.005–1.030)
pH: 8 (ref 5.0–8.0)

## 2021-02-26 LAB — CBC WITH DIFFERENTIAL/PLATELET
Abs Immature Granulocytes: 0.12 10*3/uL — ABNORMAL HIGH (ref 0.00–0.07)
Basophils Absolute: 0.1 10*3/uL (ref 0.0–0.1)
Basophils Relative: 0 %
Eosinophils Absolute: 0.1 10*3/uL (ref 0.0–0.5)
Eosinophils Relative: 1 %
HCT: 34.3 % — ABNORMAL LOW (ref 36.0–46.0)
Hemoglobin: 11 g/dL — ABNORMAL LOW (ref 12.0–15.0)
Immature Granulocytes: 1 %
Lymphocytes Relative: 17 %
Lymphs Abs: 2.3 10*3/uL (ref 0.7–4.0)
MCH: 27.2 pg (ref 26.0–34.0)
MCHC: 32.1 g/dL (ref 30.0–36.0)
MCV: 84.7 fL (ref 80.0–100.0)
Monocytes Absolute: 1.1 10*3/uL — ABNORMAL HIGH (ref 0.1–1.0)
Monocytes Relative: 8 %
Neutro Abs: 10.1 10*3/uL — ABNORMAL HIGH (ref 1.7–7.7)
Neutrophils Relative %: 73 %
Platelets: 187 10*3/uL (ref 150–400)
RBC: 4.05 MIL/uL (ref 3.87–5.11)
RDW: 14 % (ref 11.5–15.5)
WBC: 13.7 10*3/uL — ABNORMAL HIGH (ref 4.0–10.5)
nRBC: 0 % (ref 0.0–0.2)

## 2021-02-26 LAB — D-DIMER, QUANTITATIVE: D-Dimer, Quant: 2.31 ug/mL-FEU — ABNORMAL HIGH (ref 0.00–0.50)

## 2021-02-26 LAB — I-STAT BETA HCG BLOOD, ED (MC, WL, AP ONLY): I-stat hCG, quantitative: >2000 m[IU]/mL — ABNORMAL HIGH (ref <5)

## 2021-02-26 LAB — TROPONIN I (HIGH SENSITIVITY): Troponin I (High Sensitivity): 7 ng/L (ref <18)

## 2021-02-26 MED ORDER — SULFAMETHOXAZOLE-TRIMETHOPRIM 800-160 MG PO TABS: 1.0000 | ORAL_TABLET | Freq: Two times a day (BID) | ORAL | 0 refills | Status: AC

## 2021-02-26 MED ORDER — IOHEXOL 350 MG/ML SOLN
50.0000 mL | Freq: Once | INTRAVENOUS | Status: AC | PRN
  Administered 2021-02-26: 50 mL via INTRAVENOUS

## 2021-02-26 NOTE — ED Triage Notes (Signed)
BIB [facility] after OB staff called to report pt had an witnessed LOC event while pt was being seen. Per EMS, pt not post ictal, no incontinence,    Hx: Pregnancy, gestational diabetes

## 2021-02-26 NOTE — ED Notes (Signed)
ED Provider at bedside. 

## 2021-02-26 NOTE — ED Notes (Signed)
Pt provided discharge instructions and prescription information. Pt was given the opportunity to ask questions and questions were answered. Discharge signature not obtained in the setting of the COVID-19 pandemic in order to reduce high touch surfaces.  ° °

## 2021-02-26 NOTE — ED Provider Notes (Signed)
[facility] EMERGENCY DEPARTMENT Provider Note   CSN: [id number] Arrival date & time: 02/26/21  1043     History Chief Complaint  Patient presents with   Loss of Consciousness    Wendy Kidd is a 25 y.o. female presenting to the emergency department with an episode of syncope versus near syncope.  The patient is G2P0010 at [redacted]w[redacted]d, presenting from [facility] OB appointment with syncope episode.  Per history provided by the patient, the patient's husband, as well as the note from her obstetrician, the patient had just completed a pelvic exam at the office, states that she began to feel very lightheaded.  Her husband reports that "she lost all color in her face and lips and turned pale".  He says that the patient then slid to the floor, the patient was mumbling on the ground, no complete loss of consciousness.  Per the medical obstetrician note, she had a normal pulse at the time.  She had some hand clenching was making moaning noises.  Her husband believes entire event lasted less than 5 minutes, after which the patient was awake and verbalizing, without confusion.  The patient reports she has a similar episodes of lightheadedness in the past, both with "Braxton Hicks contractions," and with stress during pregnancy.  She says prior to this pregnancy she never had episodes of lightheadedness or syncope with stress, pain.  HPI     Past Medical History:  Diagnosis Date   Asthma    History of ectopic pregnancy 08/08/2020   Medical history non-contributory     Patient Active Problem List   Diagnosis Date Noted   Syncope 02/26/2021   Asthma affecting pregnancy in third trimester 02/11/2021   Gestational diabetes 01/17/2021   GBS (group B streptococcus) UTI complicating pregnancy 01/16/2021   Abnormal fetal ultrasound 01/16/2021   Supervision of high risk pregnancy, antepartum 12/20/2020   Maternal varicella, non-immune 08/13/2020    Past Surgical History:   Procedure Laterality Date   DIAGNOSTIC LAPAROSCOPY WITH REMOVAL OF ECTOPIC PREGNANCY Right 04/15/2020   Procedure: DIAGNOSTIC LAPAROSCOPY WITH REMOVAL OF ECTOPIC PREGNANCY;  Surgeon: Reva Bores, MD;  Location: [facility] OR;  Service: Gynecology;  Laterality: Right;     OB History     Gravida  2   Para  0   Term  0   Preterm  0   AB  1   Living  0      SAB  0   IAB      Ectopic  1   Multiple  0   Live Births  0           Family History  Problem Relation Age of Onset   Asthma Mother     Social History   Tobacco Use   Smoking status: Never   Smokeless tobacco: Never  Vaping Use   Vaping Use: Never used  Substance Use Topics   Alcohol use: Never   Drug use: Never    Home Medications Prior to Admission medications   Medication Sig Start Date End Date Taking? Authorizing Provider  calcium carbonate (TUMS) 500 MG chewable tablet Chew 2 tablets by mouth at bedtime as needed for indigestion or heartburn.   Yes [provider]  fluticasone-salmeterol (ADVAIR HFA) 45-21 MCG/ACT inhaler Inhale 2 puffs into the lungs 2 (two) times daily. Patient taking differently: Inhale 2 puffs into the lungs 2 (two) times daily as needed (sob). 02/01/21  Yes Adam Phenix, MD  montelukast Sharene Butters)  10 MG tablet Take 1 tablet (10 mg total) by mouth at bedtime. 02/11/21  Yes Almena Bing, MD  Prenatal Vit-Fe Fumarate-FA (PREPLUS) 27-1 MG TABS Take 1 tablet by mouth daily.   Yes [provider]  sulfamethoxazole-trimethoprim (BACTRIM DS) 800-160 MG tablet Take 1 tablet by mouth 2 (two) times daily for 3 days. 02/26/21 03/01/21 Yes Charliee Krenz, Kermit Balo, MD  Accu-Chek Softclix Lancets lancets 1 each by Other route 4 (four) times daily. To check blood sugars 4 times a day. Fasting before breakfast and 2 hours after breakfast, lunch and dinner 01/17/21   Hermina Staggers, MD  glucose blood (ACCU-CHEK GUIDE) test strip To use to check blood sugars 4 times a day. 01/17/21    Hermina Staggers, MD    Allergies    Patient has no known allergies.  Review of Systems   Review of Systems  Constitutional:  Negative for chills and fever.  Eyes:  Negative for pain and visual disturbance.  Respiratory:  Negative for cough and shortness of breath.   Cardiovascular:  Negative for chest pain and palpitations.  Gastrointestinal:  Negative for abdominal pain and vomiting.  Genitourinary:  Negative for dysuria and hematuria.  Musculoskeletal:  Negative for arthralgias and back pain.  Skin:  Negative for color change and rash.  Neurological:  Positive for light-headedness. Negative for seizures, facial asymmetry, speech difficulty, weakness and headaches.  All other systems reviewed and are negative.  Physical Exam Updated Vital Signs BP 109/63   Pulse 96   Temp 98.2 F (36.8 C) (Oral)   Resp 20   LMP 06/19/2020 (Exact Date)   SpO2 100%   Physical Exam Constitutional:      General: She is not in acute distress. HENT:     Head: Normocephalic and atraumatic.  Eyes:     Conjunctiva/sclera: Conjunctivae normal.     Pupils: Pupils are equal, round, and reactive to light.  Cardiovascular:     Rate and Rhythm: Regular rhythm. Tachycardia present.  Pulmonary:     Effort: Pulmonary effort is normal. No respiratory distress.  Abdominal:     General: There is no distension.     Tenderness: There is no abdominal tenderness.     Comments: Gravid uterus, soft  Skin:    General: Skin is warm and dry.  Neurological:     General: No focal deficit present.     Mental Status: She is alert and oriented to person, place, and time. Mental status is at baseline.     Cranial Nerves: No cranial nerve deficit.     Sensory: No sensory deficit.     Motor: No weakness.     Gait: Gait normal.  Psychiatric:        Mood and Affect: Mood normal.        Behavior: Behavior normal.    ED Results / Procedures / Treatments   Labs (all labs ordered are listed, but only abnormal  results are displayed) Labs Reviewed  COMPREHENSIVE METABOLIC PANEL - Abnormal; Notable for the following components:      Result Value   Sodium 134 (*)    Glucose, Bld 107 (*)    BUN <5 (*)    Albumin 2.7 (*)    Alkaline Phosphatase 153 (*)    All other components within normal limits  CBC WITH DIFFERENTIAL/PLATELET - Abnormal; Notable for the following components:   WBC 13.7 (*)    Hemoglobin 11.0 (*)    HCT 34.3 (*)    Neutro  Abs 10.1 (*)    Monocytes Absolute 1.1 (*)    Abs Immature Granulocytes 0.12 (*)    All other components within normal limits  URINALYSIS, ROUTINE W REFLEX MICROSCOPIC - Abnormal; Notable for the following components:   APPearance CLOUDY (*)    Hgb urine dipstick MODERATE (*)    Leukocytes,Ua LARGE (*)    Bacteria, UA MANY (*)    All other components within normal limits  D-DIMER, QUANTITATIVE - Abnormal; Notable for the following components:   D-Dimer, Quant 2.31 (*)    All other components within normal limits  I-STAT BETA HCG BLOOD, ED ([facility], [facility], [facility] ONLY) - Abnormal; Notable for the following components:   I-stat hCG, quantitative >2,000.0 (*)    All other components within normal limits  URINE CULTURE  CBC  TROPONIN I (HIGH SENSITIVITY)    EKG EKG Interpretation  Date/Time:  Tuesday February 26 2021 10:41:45 EDT Ventricular Rate:  98 PR Interval:  124 QRS Duration: 84 QT Interval:  346 QTC Calculation: 441 R Axis:   97 Text Interpretation: Normal sinus rhythm with sinus arrhythmia Rightward axis Borderline ECG Confirmed by Alvester Chou [phone]) on 02/26/2021 12:20:28 PM  Radiology CT Angio Chest PE W and/or Wo Contrast  Result Date: 02/26/2021 CLINICAL DATA:  PE suspected, high prob [redacted] weeks gestation, here with syncope + chest pain ongoing 1 week, tachycardia, +ddimer, evaluate for PE - patient consented regarding risks in pregnancy EXAM: CT ANGIOGRAPHY CHEST WITH CONTRAST TECHNIQUE: Multidetector CT imaging of the chest was performed  using the standard protocol during bolus administration of intravenous contrast. Multiplanar CT image reconstructions and MIPs were obtained to evaluate the vascular anatomy. CONTRAST:  11mL OMNIPAQUE IOHEXOL 350 MG/ML SOLN COMPARISON:  None. FINDINGS: Cardiovascular: Spine No filling defects in the pulmonary arteries to suggest pulmonary emboli. Heart is normal size. Aorta is normal caliber. Mediastinum/Nodes: No mediastinal, hilar, or axillary adenopathy. Trachea and esophagus are unremarkable. Thyroid unremarkable. Lungs/Pleura: Lungs are clear. No focal airspace opacities or suspicious nodules. No effusions. Upper Abdomen: Imaging into the upper abdomen demonstrates no acute findings. Musculoskeletal: Chest wall soft tissues are unremarkable. No acute bony abnormality. Review of the MIP images confirms the above findings. IMPRESSION: No evidence of pulmonary embolus. No acute cardiopulmonary disease. Electronically Signed   By: Charlett Nose M.D.   On: 02/26/2021 15:43    Procedures Procedures   Medications Ordered in ED Medications  iohexol (OMNIPAQUE) 350 MG/ML injection 50 mL (50 mLs Intravenous Contrast Given 02/26/21 1538)    ED Course  I have reviewed the triage vital signs and the nursing notes.  Pertinent labs & imaging results that were available during my care of the patient were reviewed by me and considered in my medical decision making (see chart for details).  Ddx includes vasovagal episode most likely vs arrhythmia vs PE vs anemia vs other  This sounds vasovagal.  She reports being "tense and uncomfortable" after her cervical exam, and became lightheaded afterwards.  In the past she's had near-syncope with braxton-hicks contractions and "pain" in pregnancy.  Her prodrome of lightheadedness and color draining from the face by husband's report is consistent with a vagal episode.  I messaged her OB provider and advised her to discuss this with her OB team regarding her upcoming plans  for natural labor, which may put her at risk for vasovagal syncope.  Less likely a seizure with no prior hx, with her prodromal symptoms today, and no post-ictal state.  Labs reviewed, interpreted.  Ddimer  elevated 2.3.  Trop 7. WBC chronically elevated at 13.7 - baseline level HR also chronically elevated per chart/outpatient review of records.  Not unusual for HR 110-120 bpm at rest.  With her complaint of chest pain x 1 week (tightness) and SOB, I could not exclude PE.  She and her husband agreed to workup, with positive Ddimer initially.  Subsequent CT PE reviewed showing no acute PE, no evidence of PNA or PTX or other life-threatening emergency.  UA with +hgb, leuks, bacteria, negative nitrites.  I did request a urine culture add on but it appears this may not have been added prior to her discharge unfortunately.  Pt started on bactrim x 3 days.  Clinical Course as of 02/26/21 1808  Tue Feb 26, 2021  1418 Patient and her husband updated about the positive D-dimer results and need for CT scan.  They are in agreement with this.  She was consented for the scan including the risk of pregnancy.  She denies dysuria or UTI symptoms, but states she has never had symptoms in the past. [MT]  1547 PE study is negative.  Anticipate discharge home [MT]    Clinical Course User Index [MT] Alejandra Barna, Kermit Balo, MD   Final Clinical Impression(s) / ED Diagnoses Final diagnoses:  Near syncope    Rx / DC Orders ED Discharge Orders          Ordered    sulfamethoxazole-trimethoprim (BACTRIM DS) 800-160 MG tablet  2 times daily        02/26/21 1622             Terald Sleeper, MD 02/26/21 [phone]

## 2021-02-26 NOTE — Patient Instructions (Signed)

## 2021-02-26 NOTE — Progress Notes (Signed)
   Subjective:  Wendy Kidd is a 25 y.o. G2P0010 at [redacted]w[redacted]d being seen today for ongoing prenatal care.  She is currently monitored for the following issues for this high-risk pregnancy and has Maternal varicella, non-immune; Supervision of high risk pregnancy, antepartum; GBS (group B streptococcus) UTI complicating pregnancy; Abnormal fetal ultrasound; Gestational diabetes; and Asthma affecting pregnancy in third trimester on their problem list.  Patient reports no complaints.  Contractions: Irritability. Vag. Bleeding: None.  Movement: Present. Denies leaking of fluid.   The following portions of the patient's history were reviewed and updated as appropriate: allergies, current medications, past family history, past medical history, past social history, past surgical history and problem list. Problem list updated.  Objective:   Vitals:   02/26/21 0913  BP: 129/77  Pulse: (!) 138  Weight: 177 lb 3.2 oz (80.4 kg)    Fetal Status: Fetal Heart Rate (bpm): 138   Movement: Present     General:  Alert, oriented and cooperative. Patient is in no acute distress.  Skin: Skin is warm and dry. No rash noted.   Cardiovascular: Normal heart rate noted  Respiratory: Normal respiratory effort, no problems with respiration noted  Abdomen: Soft, gravid, appropriate for gestational age. Pain/Pressure: Present     Pelvic: Vag. Bleeding: None     Cervical exam deferred        Extremities: Normal range of motion.  Edema: None  Mental Status: Normal mood and affect. Normal behavior. Normal judgment and thought content.   Urinalysis:      Assessment and Plan:  Pregnancy: G2P0010 at [redacted]w[redacted]d  1. Supervision of high risk pregnancy, antepartum BP and FHR normal Discussed contraception, will consider LARC  2. Gestational diabetes mellitus (GDM), antepartum, gestational diabetes method of control unspecified Diet controlled Log reviewed, sugars 100% at goal  3. Group B Streptococcus urinary tract infection  affecting pregnancy in third trimester Tx in labor  4. Abnormal fetal ultrasound Ventriculomegaly, resolved on most recent US No further scans ordered  5. Asthma affecting pregnancy in third trimester Stable  6. Syncopal event vs seizure event Right after a cervical check and while discussing birth control patient became unresponsive. She asked for water, took a sip, then became unresponsive. Had some turning of her head to her right as well as hand clenching, made some moaning noises. Both partner and I attempted to get her to respond, initially was responding to my voice then stopped. Had a normal pulse and breathing throughout. Event ended suddenly and patient was fully alert and oriented, telling her partner she was fine. Vital signs were obtained and were unremarkable. She reports she has had something similar when she has gotten strong contraction pain. No diaphoresis observed during event. Overall I think this was most likely a vagal event given no post-ictal state, sudden onset after having a cervical check, and brief duration. That being said some features not totally consistent with this. EMS activated to transpot patient to MAU and signout given to MAU provider.   Preterm labor symptoms and general obstetric precautions including but not limited to vaginal bleeding, contractions, leaking of fluid and fetal movement were reviewed in detail with the patient. Please refer to After Visit Summary for other counseling recommendations.  Return in 1 week (on 03/05/2021) for [facility], ob visit.   Venora Maples, MD

## 2021-02-26 NOTE — ED Provider Notes (Addendum)
Emergency Medicine Provider Triage Evaluation Note  Wendy Kidd , a 25 y.o. female  was evaluated in triage.  Pt complains of could be.  Patient is [redacted] weeks pregnant today (uncomplicated pregnancy) and [facility] felt suddenly "woozy."  States that she then apparently passed out for unknown amount of time.  States that she felt like she was dreaming.  States she was seated when this happened.  Denies falling or hitting her abdomen.  She denies changes in vision or palpitations during the episode.  Denies abdominal pain or cramping, vaginal bleeding.  She states that she has not eaten yet today and thought that maybe part of the issue.  She also has a history of ectopic pregnancy back in November that required surgical intervention.  She was on birth control at the time.  She states that at the OB/GYN appointment they were talking about restarting birth control after her delivery and she felt somewhat anxious.  Denies chest pain, shortness of breath, headache.  Review of Systems  Positive: Syncope Negative: Abdominal pain, vaginal bleeding, chest pain, shortness of breath  Physical Exam  BP 118/77 (BP Location: Left Arm)   Pulse 92   Temp 98.2 F (36.8 C) (Oral)   Resp 16   LMP 06/19/2020 (Exact Date)   SpO2 100%  Gen:   Awake, no distress   Resp:  Normal effort MSK:   Moves extremities without difficulty  Other:  S1/S2 without murmur.  Lungs are clear.  Pulses equal bilaterally 2+.  No lower extremity edema.  Abdomen nontender to palpation.  Not rigid. obviously pregnant abdomen.  Medical Decision Making  Medically screening exam initiated at 10:57 AM.  Appropriate orders placed.  Jolena Kittle was informed that the remainder of the evaluation will be completed by another provider, this initial triage assessment does not replace that evaluation, and the importance of remaining in the ED until their evaluation is complete.     Cristopher Peru, PA-C 02/26/21  1100    Cristopher Peru, PA-C 02/26/21 1101    Terald Sleeper, MD 02/26/21 [phone]

## 2021-02-26 NOTE — Discharge Instructions (Signed)
Your blood test and CT scan did not show signs of blood clot, heart attack, or other medical emergencies.  Please talk to your [facility] doctor about these episodes of syncope.  This may be related to episodes causing stress and vasovagal stimulation in your neck.  You should make sure your doctors are aware of the situation moving forward in your pregnancy.  If you have or begin to feel lightheaded again at home, please lay her self down quickly and gently on the floor and lay on your left side.  This will improve blood flow to your brain.  If you do lose consciousness, have your family member called 911 immediately.

## 2021-02-27 LAB — GC/CHLAMYDIA PROBE AMP (~~LOC~~) NOT AT ARMC
Chlamydia: NEGATIVE
Comment: NEGATIVE
Comment: NORMAL
Neisseria Gonorrhea: NEGATIVE

## 2021-03-06 ENCOUNTER — Other Ambulatory Visit: Payer: Self-pay

## 2021-03-06 ENCOUNTER — Ambulatory Visit (INDEPENDENT_AMBULATORY_CARE_PROVIDER_SITE_OTHER): Payer: No Typology Code available for payment source | Admitting: Family Medicine

## 2021-03-06 VITALS — BP 132/83 | HR 103 | Wt 175.8 lb

## 2021-03-06 DIAGNOSIS — O0990 Supervision of high risk pregnancy, unspecified, unspecified trimester: Secondary | ICD-10-CM

## 2021-03-06 DIAGNOSIS — O2343 Unspecified infection of urinary tract in pregnancy, third trimester: Secondary | ICD-10-CM

## 2021-03-06 DIAGNOSIS — R55 Syncope and collapse: Secondary | ICD-10-CM

## 2021-03-06 DIAGNOSIS — O24419 Gestational diabetes mellitus in pregnancy, unspecified control: Secondary | ICD-10-CM

## 2021-03-06 DIAGNOSIS — B951 Streptococcus, group B, as the cause of diseases classified elsewhere: Secondary | ICD-10-CM

## 2021-03-06 NOTE — Patient Instructions (Signed)

## 2021-03-06 NOTE — Progress Notes (Signed)
   Subjective:  Wendy Kidd is a 25 y.o. G2P0010 at [redacted]w[redacted]d being seen today for ongoing prenatal care.  She is currently monitored for the following issues for this high-risk pregnancy and has Maternal varicella, non-immune; Supervision of high risk pregnancy, antepartum; GBS (group B streptococcus) UTI complicating pregnancy; Abnormal fetal ultrasound; Gestational diabetes; Asthma affecting pregnancy in third trimester; and Syncope on their problem list.  Patient reports no complaints.  Contractions: Irritability. Vag. Bleeding: None.  Movement: Present. Denies leaking of fluid.   The following portions of the patient's history were reviewed and updated as appropriate: allergies, current medications, past family history, past medical history, past social history, past surgical history and problem list. Problem list updated.  Objective:   Vitals:   03/06/21 1623  BP: 132/83  Pulse: (!) 103  Weight: 175 lb 12.8 oz (79.7 kg)    Fetal Status: Fetal Heart Rate (bpm): 143   Movement: Present     General:  Alert, oriented and cooperative. Patient is in no acute distress.  Skin: Skin is warm and dry. No rash noted.   Cardiovascular: Normal heart rate noted  Respiratory: Normal respiratory effort, no problems with respiration noted  Abdomen: Soft, gravid, appropriate for gestational age. Pain/Pressure: Present     Pelvic: Vag. Bleeding: None     Cervical exam deferred        Extremities: Normal range of motion.  Edema: None  Mental Status: Normal mood and affect. Normal behavior. Normal judgment and thought content.   Urinalysis:      Assessment and Plan:  Pregnancy: G2P0010 at [redacted]w[redacted]d  1. Supervision of high risk pregnancy, antepartum BP and FHR normal  2. Gestational diabetes mellitus (GDM) in third trimester, gestational diabetes method of control unspecified Sugars well controlled on diet Last growth [place] 9/28, EFW 2488g, 60%  3. Group B Streptococcus urinary tract infection affecting  pregnancy in third trimester   4. Syncope, unspecified syncope type Negative workup in ED after last visit Likely vasovagal ED provider had suggested route of delivery may be in question Reassured patient that vaginal delivery would be safe and preferable  Term labor symptoms and general obstetric precautions including but not limited to vaginal bleeding, contractions, leaking of fluid and fetal movement were reviewed in detail with the patient. Please refer to After Visit Summary for other counseling recommendations.  Return in 1 week (on 03/13/2021).   Venora Maples, MD

## 2021-03-06 NOTE — Progress Notes (Deleted)
   Subjective:  Wendy Kidd is a 25 y.o. G2P0010 at [redacted]w[redacted]d being seen today for ongoing prenatal care.  She is currently monitored for the following issues for this high-risk pregnancy and has Maternal varicella, non-immune; Supervision of high risk pregnancy, antepartum; GBS (group B streptococcus) UTI complicating pregnancy; Abnormal fetal ultrasound; Gestational diabetes; Asthma affecting pregnancy in third trimester; and Syncope on their problem list.  Patient reports no complaints.  Contractions: Irritability. Vag. Bleeding: None.  Movement: Present. Denies leaking of fluid.   The following portions of the patient's history were reviewed and updated as appropriate: allergies, current medications, past family history, past medical history, past social history, past surgical history and problem list. Problem list updated.  Objective:   Vitals:   03/06/21 1623  BP: 132/83  Pulse: (!) 103  Weight: 175 lb 12.8 oz (79.7 kg)    Fetal Status: Fetal Heart Rate (bpm): 143   Movement: Present     General:  Alert, oriented and cooperative. Patient is in no acute distress.  Skin: Skin is warm and dry. No rash noted.   Cardiovascular: Normal heart rate noted  Respiratory: Normal respiratory effort, no problems with respiration noted  Abdomen: Soft, gravid, appropriate for gestational age. Pain/Pressure: Present     Pelvic: Vag. Bleeding: None     {Blank single:19197::"Cervical exam performed","Cervical exam deferred"}        Extremities: Normal range of motion.  Edema: None  Mental Status: Normal mood and affect. Normal behavior. Normal judgment and thought content.   Urinalysis:      Assessment and Plan:  Pregnancy: G2P0010 at [redacted]w[redacted]d  1. Supervision of high risk pregnancy, antepartum ***  2. Gestational diabetes mellitus (GDM) in third trimester, gestational diabetes method of control unspecified ***  3. Group B Streptococcus urinary tract infection affecting pregnancy in third  trimester ***  4. Syncope, unspecified syncope type ***  {Blank single:19197::"Term","Preterm"} labor symptoms and general obstetric precautions including but not limited to vaginal bleeding, contractions, leaking of fluid and fetal movement were reviewed in detail with the patient. Please refer to After Visit Summary for other counseling recommendations.  Return in 1 week (on 03/13/2021).   Venora Maples, MD

## 2021-03-07 ENCOUNTER — Ambulatory Visit (INDEPENDENT_AMBULATORY_CARE_PROVIDER_SITE_OTHER): Payer: Medicaid Other

## 2021-03-07 ENCOUNTER — Ambulatory Visit (INDEPENDENT_AMBULATORY_CARE_PROVIDER_SITE_OTHER): Payer: No Typology Code available for payment source | Admitting: Cardiology

## 2021-03-07 ENCOUNTER — Encounter: Payer: Self-pay | Admitting: Cardiology

## 2021-03-07 VITALS — BP 115/72 | HR 110 | Ht 68.0 in | Wt 178.2 lb

## 2021-03-07 DIAGNOSIS — R002 Palpitations: Secondary | ICD-10-CM | POA: Diagnosis not present

## 2021-03-07 DIAGNOSIS — R55 Syncope and collapse: Secondary | ICD-10-CM | POA: Diagnosis not present

## 2021-03-07 NOTE — Progress Notes (Signed)
[facility]  New Evaluation  Date:  03/07/2021   ID:  Wendy Kidd, DOB 05/30/1995, MRN [id number]  PCP:  Vicenta Aly, [place] [facility] Cardiologist:  Berniece Salines, DO  Electrophysiologist:  None       Referring MD: Aletha Halim, MD   Chief Complaint: " I had a syncope episode"  History of Present Illness:    Wendy Kidd is a 25 y.o. female [G2P0010] who is being seen today for the evaluation of syncope at the request of Aletha Halim, MD.  The patient is currently 37 weeks and 2 days pregnant, she has a medical history of asthma, she is here today with her husband for evaluation for syncope and elevated heart rate.  The patient tells me that she has had multiple syncope episodes.  She reports that these are random but sometimes associated with her persistent headaches contractions.  She notes that the most impressive episode was when she was at the med center for 1 minute for her clinic visit after she had her pelvic exam completed she felt significantly lightheaded and her husband reported that her face turned pale and the patient had slid to the floor mumbling however no significant loss of consciousness.  They believe that the episode may have lasted at least for 5 minutes.  She was then taken to the emergency department and she was evaluated during that time and was discharged home.  She has had a few of these near syncope episodes where she notes that she gets lightheaded when she experience persistent headaches and contractions and any stress during her pregnancy.  She also tells me that she has had some increasing heart rate but not usually over 130s.  There has been an episode where she has significant palpitations before her lightheadedness other than no syncope episodes.  No shortness of breath or chest pain.  Prior CV Studies Reviewed: The following studies were reviewed today: None today   Past Medical History:  Diagnosis Date   Asthma     History of ectopic pregnancy 08/08/2020   Medical history non-contributory     Past Surgical History:  Procedure Laterality Date   DIAGNOSTIC LAPAROSCOPY WITH REMOVAL OF ECTOPIC PREGNANCY Right 04/15/2020   Procedure: DIAGNOSTIC LAPAROSCOPY WITH REMOVAL OF ECTOPIC PREGNANCY;  Surgeon: Donnamae Jude, MD;  Location: [facility];  Service: Gynecology;  Laterality: Right;      OB History     Gravida  2   Para  0   Term  0   Preterm  0   AB  1   Living  0      SAB  0   IAB      Ectopic  1   Multiple  0   Live Births  0               Current Medications: Current Meds  Medication Sig   Accu-Chek Softclix Lancets lancets 1 each by Other route 4 (four) times daily. To check blood sugars 4 times a day. Fasting before breakfast and 2 hours after breakfast, lunch and dinner   calcium carbonate (TUMS - DOSED IN MG ELEMENTAL CALCIUM) 500 MG chewable tablet Chew 2 tablets by mouth at bedtime as needed for indigestion or heartburn.   fluticasone-salmeterol (ADVAIR HFA) 45-21 MCG/ACT inhaler Inhale 2 puffs into the lungs 2 (two) times daily. (Patient taking differently: Inhale 2 puffs into the lungs 2 (two) times daily as needed (sob).)   glucose blood (ACCU-CHEK GUIDE) test  strip To use to check blood sugars 4 times a day.   montelukast (SINGULAIR) 10 MG tablet Take 1 tablet (10 mg total) by mouth at bedtime.   Prenatal Vit-Fe Fumarate-FA (PREPLUS) 27-1 MG TABS Take 1 tablet by mouth daily.     Allergies:   Patient has no known allergies.   Social History   Socioeconomic History   Marital status: Married    Spouse name: Not on file   Number of children: Not on file   Years of education: Not on file   Highest education level: Not on file  Occupational History   Not on file  Tobacco Use   Smoking status: Never   Smokeless tobacco: Never  Vaping Use   Vaping Use: Never used  Substance and Sexual Activity   Alcohol use: Never   Drug use: Never   Sexual activity: Yes   Other Topics Concern   Not on file  Social History Narrative   Not on file   Social Determinants of Health   Financial Resource Strain: Not on file  Food Insecurity: No Food Insecurity   Worried About Running Out of Food in the Last Year: Never true   [place] in the Last Year: Never true  Transportation Needs: No Transportation Needs   Lack of Transportation (Medical): No   Lack of Transportation (Non-Medical): No  Physical Activity: Not on file  Stress: Not on file  Social Connections: Not on file      Family History  Problem Relation Age of Onset   Asthma Mother       ROS:   Please see the history of present illness.     Review of Systems  Constitution: Reports lightheadedness.  Negative for decreased appetite, fever and weight gain.  HENT: Negative for congestion, ear discharge, hoarse voice and sore throat.   Eyes: Negative for discharge, redness, vision loss in right eye and visual halos.  Cardiovascular: Negative for chest pain, dyspnea on exertion, leg swelling, orthopnea and palpitations.  Respiratory: Negative for cough, hemoptysis, shortness of breath and snoring.   Endocrine: Negative for heat intolerance and polyphagia.  Hematologic/Lymphatic: Negative for bleeding problem. Does not bruise/bleed easily.  Skin: Negative for flushing, nail changes, rash and suspicious lesions.  Musculoskeletal: Negative for arthritis, joint pain, muscle cramps, myalgias, neck pain and stiffness.  Gastrointestinal: Negative for abdominal pain, bowel incontinence, diarrhea and excessive appetite.  Genitourinary: Negative for decreased libido, genital sores and incomplete emptying.  Neurological: Negative for brief paralysis, focal weakness, headaches and loss of balance.  Psychiatric/Behavioral: Negative for altered mental status, depression and suicidal ideas.  Allergic/Immunologic: Negative for HIV exposure and persistent infections.     Labs/EKG Reviewed:    EKG:    EKG is was ordered today.  The ekg ordered today demonstrates sinus tachycardia.  Recent Labs: 02/26/2021: ALT 13; BUN <5; Creatinine, Ser 0.58; Hemoglobin 11.0; Platelets 187; Potassium 3.8; Sodium 134   Recent Lipid Panel No results found for: CHOL, TRIG, HDL, CHOLHDL, LDLCALC, LDLDIRECT  Physical Exam:    VS:  BP 115/72   Pulse (!) 110   Ht _0  (1.727 m)   Wt 178 lb 3.2 oz (80.8 kg)   LMP 06/19/2020 (Exact Date)   SpO2 97%   BMI 27.10 kg/m     Wt Readings from Last 3 Encounters:  03/07/21 178 lb 3.2 oz (80.8 kg)  03/06/21 175 lb 12.8 oz (79.7 kg)  02/26/21 177 lb 3.2 oz (80.4 kg)  GEN:  Well nourished, well developed in no acute distress HEENT: Normal NECK: No JVD; No carotid bruits LYMPHATICS: No lymphadenopathy CARDIAC: RRR, no murmurs, rubs, gallops RESPIRATORY:  Clear to auscultation without rales, wheezing or rhonchi  ABDOMEN: Soft, non-tender, non-distended MUSCULOSKELETAL:  No edema; No deformity  SKIN: Warm and dry NEUROLOGIC:  Alert and oriented x 3 PSYCHIATRIC:  Normal affect    Risk Assessment/Risk Calculators:     CARPREG II Risk Prediction Index Score:  1.  The patient's risk for a primary cardiac event is 5%.   Modified World Health Organization [facility]) Classification of Maternal CV Risk   Class I         ASSESSMENT & PLAN:    Syncope-which I suspect that vasovagal syncope is significant playing a role here.  Given she has had a few episodes that she had been experiencing palpitation during that time electively we will do a life monitor on the patient to make sure significant arrhythmia occurring. I also educated the patient that her heart rate is going up into the 120s to 130s which is her normal pregnancy phenomenon and we will going to monitor this as well.  All of the questions during the visit today has been answered.  She will follow-up in the outpatient setting should she have any follow-up syncopes prior to her next visit an  echocardiogram will be performed to assess her LV function and for any structural abnormalities.   Patient Instructions  Medication Instructions:  Your physician recommends that you continue on your current medications as directed. Please refer to the Current Medication list given to you today.  *If you need a refill on your cardiac medications before your next appointment, please call your pharmacy*   Lab Work: None If you have labs (blood work) drawn today and your tests are completely normal, you will receive your results only by: [place] (if you have MyChart) OR A paper copy in the mail If you have any lab test that is abnormal or we need to change your treatment, we will call you to review the results.   Testing/Procedures: ZIO AT Long term monitor-Live Telemetry  Your physician has requested you wear a ZIO patch monitor for 7 days.  This is a single patch monitor. [employer] supplies one patch monitor per enrollment. Additional  stickers are not available.  Please do not apply patch if you will be having a Nuclear Stress Test, Echocardiogram, Cardiac CT, MRI,  or Chest Xray during the period you would be wearing the monitor. The patch cannot be worn during  these tests. You cannot remove and re-apply the ZIO AT patch monitor.  Your ZIO patch monitor will be mailed 3 day USPS to your address on file. It may take 3-5 days to  receive your monitor after you have been enrolled.  Once you have received your monitor, please review the enclosed instructions. Your monitor has  already been registered assigning a specific monitor serial # to you.   Billing and Patient Assistance Program information  Theodore Demark has been supplied with any insurance information on record for billing. [employer] offers a sliding scale Patient Assistance Program for patients without insurance, or whose  insurance does not completely cover the cost of the ZIO patch monitor. You must apply for the  Patient  Assistance Program to qualify for the discounted rate. To apply, call [employer] at [phone],  select option 4, select option 2 , ask to apply for the Patient Assistance Program, (you can request  an  interpreter if needed). [employer] will ask your household income and how many people are in your  household. [employer] will quote your out-of-pocket cost based on this information. They will also be able  to set up a 12 month interest free payment plan if needed.  Applying the monitor   Shave hair from upper left chest.  Hold the abrader disc by orange tab. Rub the abrader in 40 strokes over left upper chest as indicated in  your monitor instructions.  Clean area with 4 enclosed alcohol pads. Use all pads to ensure the area is cleaned thoroughly. Let  dry.  Apply patch as indicated in monitor instructions. Patch will be placed under collarbone on left side of  chest with arrow pointing upward.  Rub patch adhesive wings for 2 minutes. Remove the white label marked "1". Remove the white label  marked "2". Rub patch adhesive wings for 2 additional minutes.  While looking in a mirror, press and release button in center of patch. A small green light will flash 3-4  times. This will be your only indicator that the monitor has been turned on.  Do not shower for the first 24 hours. You may shower after the first 24 hours.  Press the button if you feel a symptom. You will hear a small click. Record Date, Time and Symptom in  the Patient Log.   Starting the Gateway  In your kit there is a Hydrographic surveyor box the size of a cellphone. This is Airline pilot. It transmits all your  recorded data to [facility]. This box must always stay within 10 feet of you. Open the box and push the *  button. There will be a light that blinks orange and then green a few times. When the light stops  blinking, the Gateway is connected to the ZIO patch. Call [employer] at [phone] to confirm your monitor is  transmitting.  Returning your monitor  Remove your patch and place it inside the [place]. In the lower half of the Gateway there is a white  bag with prepaid postage on it. Place Gateway in bag and seal. Mail package back to [place] as soon as  possible. Your physician should have your final report approximately 7 days after you have mailed back  your monitor. Call [facility] at [phone] if you have questions regarding your ZIO AT  patch monitor. Call them immediately if you see an orange light blinking on your monitor.  If your monitor falls off in less than 4 days, contact our Monitor department at [phone]. If your  monitor becomes loose or falls off after 4 days call [employer] at [phone] for suggestions on  securing your monitor    Follow-Up: At [facility], you and your health needs are our priority.  As part of our continuing mission to provide you with exceptional heart care, we have created designated Provider Care Teams.  These Care Teams include your primary Cardiologist (physician) and Advanced Practice Providers (APPs -  Physician Assistants and Nurse Practitioners) who all work together to provide you with the care you need, when you need it.  We recommend signing up for the patient portal called "MyChart".  Sign up information is provided on this After Visit Summary.  MyChart is used to connect with patients for Virtual Visits (Telemedicine).  Patients are able to view lab/test results, encounter notes, upcoming appointments, etc.  Non-urgent messages can be sent to your provider as well.   To learn  more about what you can do with MyChart, go to [email or web address].    The format for your next appointment:   In Person  Provider:   Berniece Salines, DO [address], [place], [place] [address]    Other Instructions     Dispo:  No follow-ups on file.   Medication Adjustments/Labs and Tests Ordered: Current medicines are  reviewed at length with the patient today.  Concerns regarding medicines are outlined above.  Tests Ordered: Orders Placed This Encounter  Procedures   LONG TERM MONITOR-LIVE TELEMETRY (3-14 DAYS)   EKG 12-Lead   Medication Changes: No orders of the defined types were placed in this encounter.

## 2021-03-07 NOTE — Progress Notes (Unsigned)
Patient to have 7 day ZIO AT monitor from office inventory applied at [facility] office.

## 2021-03-07 NOTE — Patient Instructions (Addendum)
Medication Instructions:  Your physician recommends that you continue on your current medications as directed. Please refer to the Current Medication list given to you today.  *If you need a refill on your cardiac medications before your next appointment, please call your pharmacy*   Lab Work: None If you have labs (blood work) drawn today and your tests are completely normal, you will receive your results only by: [place] (if you have MyChart) OR A paper copy in the mail If you have any lab test that is abnormal or we need to change your treatment, we will call you to review the results.   Testing/Procedures: ZIO AT Long term monitor-Live Telemetry  Your physician has requested you wear a ZIO patch monitor for 7 days.  This is a single patch monitor. [employer] supplies one patch monitor per enrollment. Additional  stickers are not available.  Please do not apply patch if you will be having a Nuclear Stress Test, Echocardiogram, Cardiac CT, MRI,  or Chest Xray during the period you would be wearing the monitor. The patch cannot be worn during  these tests. You cannot remove and re-apply the ZIO AT patch monitor.  Your ZIO patch monitor will be mailed 3 day USPS to your address on file. It may take 3-5 days to  receive your monitor after you have been enrolled.  Once you have received your monitor, please review the enclosed instructions. Your monitor has  already been registered assigning a specific monitor serial # to you.   Billing and Patient Assistance Program information  Theodore Demark has been supplied with any insurance information on record for billing. [employer] offers a sliding scale Patient Assistance Program for patients without insurance, or whose  insurance does not completely cover the cost of the ZIO patch monitor. You must apply for the  Patient Assistance Program to qualify for the discounted rate. To apply, call [employer] at [phone],  select option 4, select  option 2 , ask to apply for the Patient Assistance Program, (you can request an  interpreter if needed). [employer] will ask your household income and how many people are in your  household. [employer] will quote your out-of-pocket cost based on this information. They will also be able  to set up a 12 month interest free payment plan if needed.  Applying the monitor   Shave hair from upper left chest.  Hold the abrader disc by orange tab. Rub the abrader in 40 strokes over left upper chest as indicated in  your monitor instructions.  Clean area with 4 enclosed alcohol pads. Use all pads to ensure the area is cleaned thoroughly. Let  dry.  Apply patch as indicated in monitor instructions. Patch will be placed under collarbone on left side of  chest with arrow pointing upward.  Rub patch adhesive wings for 2 minutes. Remove the white label marked "1". Remove the white label  marked "2". Rub patch adhesive wings for 2 additional minutes.  While looking in a mirror, press and release button in center of patch. A small green light will flash 3-4  times. This will be your only indicator that the monitor has been turned on.  Do not shower for the first 24 hours. You may shower after the first 24 hours.  Press the button if you feel a symptom. You will hear a small click. Record Date, Time and Symptom in  the Patient Log.   Starting the Gateway  In your kit there is a Optometrist  the size of a cellphone. This is Airline pilot. It transmits all your  recorded data to [facility]. This box must always stay within 10 feet of you. Open the box and push the *  button. There will be a light that blinks orange and then green a few times. When the light stops  blinking, the Gateway is connected to the ZIO patch. Call [employer] at [phone] to confirm your monitor is transmitting.  Returning your monitor  Remove your patch and place it inside the [place]. In the lower half of the Gateway there is a white   bag with prepaid postage on it. Place Gateway in bag and seal. Mail package back to [place] as soon as  possible. Your physician should have your final report approximately 7 days after you have mailed back  your monitor. Call [facility] at [phone] if you have questions regarding your ZIO AT  patch monitor. Call them immediately if you see an orange light blinking on your monitor.  If your monitor falls off in less than 4 days, contact our Monitor department at [phone]. If your  monitor becomes loose or falls off after 4 days call [employer] at [phone] for suggestions on  securing your monitor    Follow-Up: At [facility], you and your health needs are our priority.  As part of our continuing mission to provide you with exceptional heart care, we have created designated Provider Care Teams.  These Care Teams include your primary Cardiologist (physician) and Advanced Practice Providers (APPs -  Physician Assistants and Nurse Practitioners) who all work together to provide you with the care you need, when you need it.  We recommend signing up for the patient portal called "MyChart".  Sign up information is provided on this After Visit Summary.  MyChart is used to connect with patients for Virtual Visits (Telemedicine).  Patients are able to view lab/test results, encounter notes, upcoming appointments, etc.  Non-urgent messages can be sent to your provider as well.   To learn more about what you can do with MyChart, go to [email or web address].    The format for your next appointment:   In Person  Provider:   Berniece Salines, DO [address], [place], [place] [address]    Other Instructions

## 2021-03-14 ENCOUNTER — Ambulatory Visit (INDEPENDENT_AMBULATORY_CARE_PROVIDER_SITE_OTHER): Payer: No Typology Code available for payment source | Admitting: Family Medicine

## 2021-03-14 ENCOUNTER — Other Ambulatory Visit: Payer: Self-pay

## 2021-03-14 VITALS — BP 112/75 | HR 116 | Wt 180.7 lb

## 2021-03-14 DIAGNOSIS — O0990 Supervision of high risk pregnancy, unspecified, unspecified trimester: Secondary | ICD-10-CM | POA: Diagnosis not present

## 2021-03-14 DIAGNOSIS — O24410 Gestational diabetes mellitus in pregnancy, diet controlled: Secondary | ICD-10-CM

## 2021-03-14 DIAGNOSIS — B951 Streptococcus, group B, as the cause of diseases classified elsewhere: Secondary | ICD-10-CM

## 2021-03-14 DIAGNOSIS — O2343 Unspecified infection of urinary tract in pregnancy, third trimester: Secondary | ICD-10-CM

## 2021-03-14 NOTE — Progress Notes (Signed)
   PRENATAL VISIT NOTE  Subjective:  Wendy Kidd is a 25 y.o. G2P0010 at [redacted]w[redacted]d being seen today for ongoing prenatal care.  She is currently monitored for the following issues for this high-risk pregnancy and has Maternal varicella, non-immune; Supervision of high risk pregnancy, antepartum; GBS (group B streptococcus) UTI complicating pregnancy; Abnormal fetal ultrasound; Gestational diabetes; Asthma affecting pregnancy in third trimester; and Syncope on their problem list.  Patient reports no complaints.  Contractions: Irritability. Vag. Bleeding: None.  Movement: Present. Denies leaking of fluid.   The following portions of the patient's history were reviewed and updated as appropriate: allergies, current medications, past family history, past medical history, past social history, past surgical history and problem list.   Objective:   Vitals:   03/14/21 1117  BP: 112/75  Pulse: (!) 116  Weight: 180 lb 11.2 oz (82 kg)    Fetal Status: Fetal Heart Rate (bpm): 143   Movement: Present     General:  Alert, oriented and cooperative. Patient is in no acute distress.  Skin: Skin is warm and dry. No rash noted.   Cardiovascular: Normal heart rate noted  Respiratory: Normal respiratory effort, no problems with respiration noted  Abdomen: Soft, gravid, appropriate for gestational age.  Pain/Pressure: Present     Pelvic: Cervical exam deferred        Extremities: Normal range of motion.  Edema: None  Mental Status: Normal mood and affect. Normal behavior. Normal judgment and thought content.   Assessment and Plan:  Pregnancy: G2P0010 at [redacted]w[redacted]d  1. Diet controlled gestational diabetes mellitus (GDM) in third trimester BS are controlled per report EFW at 34wk= 2488g  (60th%) IOL scheduled for 11/8 in the AM Discussed outpatient FB and patient is interested, she and partner will discuss option and write if interested. Recommended decision soon to help with scheduling. Would need FB visit on  11/7 PM  2. Group B Streptococcus urinary tract infection affecting pregnancy in third trimester PCN in labor  3. Supervision of high risk pregnancy, antepartum Up to date Discussed peds care and reviewed some options Interested in LARC methods   Term labor symptoms and general obstetric precautions including but not limited to vaginal bleeding, contractions, leaking of fluid and fetal movement were reviewed in detail with the patient. Please refer to After Visit Summary for other counseling recommendations.   Return in about 1 week (around 03/21/2021) for Routine prenatal care, Telehealth/Virtual health OB Visit if desired.  Future Appointments  Date Time Provider Department Center  03/26/2021  6:30 AM [facility] SCHED ROOM [facility] None  04/08/2021 10:30 AM Tobb, [place]ona [place], DO [facility]    Federico Flake, MD

## 2021-03-14 NOTE — Patient Instructions (Signed)
AREA PEDIATRIC/FAMILY PRACTICE PHYSICIANS  Central/[place]outheast [place] ([address]) [facility] Chambliss, MD; Eniola, MD; Hale, MD; Hensel, MD; McDiarmid, MD; McIntyer, MD; Neal, MD; Walden, MD [address] [place], [place] [address] [phone] Mon-Fri 8:30-12:30, 1:30-5:00 Providers come to see babies at [facility] Accepting Medicaid [facility] providers who accept newborns: Koirala, MD; Morrow, MD; Wolters, MD [address], [place], [place] [address] [phone] Mon-Fri 8:00-5:30 Babies seen by providers at [facility] Does NOT accept Medicaid Please call early in hospitalization for appointment (limited availability)  [facility] Mulberry, MD [address] [place], [place] [address] [phone] Mon, Tue, Thur, Fri 8:30-5:00, Wed 10:00-7:00 (closed 1-2pm) Babies seen by [facility] providers Accepting Medicaid Rubin - Pediatrician Rubin, MD [address], [place], [place] [address] [phone] Mon-Fri 8:30-5:00, Sat 8:30-12:00 Provider comes to see babies at [facility] Accepting Medicaid Must have been referred from current patients or contacted office prior to delivery [facility] ([facility]) Brown, MD; Chandler, MD; Ettefagh, MD; Grant, MD; Lester, MD; McCormick, MD; McQueen, MD; Prose, MD; Simha, MD; Stanley, MD; Stryffeler, NP; Tebben, NP [address], [place], [place] [address] [phone] Mon, Tue, Thur, Fri 8:30-5:30, Wed 9:30-5:30, Sat 8:30-12:30 Babies seen by [facility] providers Accepting Medicaid Only accepting infants of first-time parents or siblings of current patients Hospital discharge coordinator will make follow-up appointment Jack Amos [address], [place], [place]  [address] [phone]   Fax - [phone] [facility] [address], [place], [place]  [address] Phone - [phone]   Fax - [phone] Shilpa Gosrani [address], [place], [place]  [address] [phone]  East/[place]ortheast [place] ([address]) [facility] Bates, MD; Brassfield, MD; Cooper, Cox, MD; MD; Davis, MD; Dovico, MD; Ettefaugh, MD; Little, MD; Lowe, MD; Keiffer, MD; Melvin, MD; Sumner, MD; Williams, MD [address], [place], [place] [address] [phone] Mon-Fri 8:30-5:00 (extended evenings Mon-Thur as needed), Sat-Sun 10:00-1:00 Providers come to see babies at [facility] Accepting Medicaid for families of first-time babies and families with all children in the household age 3 and under. Must register with office prior to making appointment (M-F only). [facility] Henson, NP; Knapp, MD; Lalonde, MD; Tysinger, PA [address] [place], [place] [address] [phone] Mon-Fri 8:00-5:00 Babies seen by providers at [facility] Does NOT accept Medicaid/Commercial Insurance Only [facility] ([facility])  Artis, MD; Barnes, MD; Bratton, MD; Coccaro, MD; Lockett Gardner, MD; Kramer, MD; Marshall, MD; Netherton, MD; Poleto, MD; Skinner, MD [address] [place], [place] [address] [phone] Mon-Fri 8:30-5:30, Sat (Oct.-Mar.) 9:00-1:00 Babies seen by providers at [facility] Accepting Medicaid  [place] ([address]) [facility] Reid, MD; Warner, MD [address], [place], [place] [address] [phone] Mon-Fri 8:30-5:00, Sat 8:30-12:00 Providers come to see babies at [facility] Does NOT accept Medicaid [facility] Becker, PA; Hagler, MD; Scifres, PA; Sun, MD; Swayne, MD [address], ,  [address] [phone] Mon-Fri 8:00-5:00 Babies seen by providers at [facility] Does NOT accept Medicaid Only accepting babies of parents who  are patients Please call early in hospitalization for appointment (limited availability)  Pediatricians Clark, MD; Frye, MD; Kelleher, MD; Mack, NP; Miller, MD; O'Keller, MD; Patterson, NP; Pudlo, MD; Puzio, MD; Thomas, MD; Tucker, MD; Twiselton, MD [address], [place], [place] [address] [phone] Mon-Fri 8:00-5:00, Sat 9:00-12:00 Providers come to see babies at [facility] Does NOT accept Medicaid  [place] ([address]) [facility] providers accepting new patients: Brake, NP; Wharton, PA [address], [place], [place] [address] [phone] Mon-Fri 8:00-5:00 Babies seen by providers at [facility] Does NOT accept Medicaid Only accepting babies of parents who are patients Please call early in hospitalization for appointment (limited availability) [facility] Gay, MD; Quinlan, MD [address] [place], [place] [address] [phone] (press 1 to schedule appointment) Mon-Fri 8:00-5:00 Providers come to see babies at [facility] Does NOT accept Medicaid [facility] Mazer, MD [address] [place], [place] [address] [phone] Mon-Fri 8:30-5:00 (lunch 12:30-1:00), extended hours by appointment only Wed 5:00-6:30 Babies seen by [facility] providers Accepting Medicaid [facility] Banks, MD; Jordan, MD; Koberlein, MD [address], [place], [place] [address] [phone] Mon-Fri 8:00-5:00 Babies seen by [facility] providers Does NOT accept Medicaid [facility] Parker, MD; Hunter, MD; Wallace, DO [address] [place], [place] [address] [phone] Mon-Fri 8:00-5:00 Babies seen by [facility] providers Does NOT accept Medicaid [facility] Brandon, PA; Brecken, PA; Christy, NP; Dees, MD; DeClaire, MD; DeWeese, MD; Hansen, NP; Mills, NP; Parrish, NP; Smoot, NP; Summer, MD; Vapne,  MD [address] [place], [place] [address] [phone] Mon-Fri 8:30-5:00, Sat 10:00-1:00 Providers come to see babies at [facility] Does NOT accept Medicaid Free prenatal information session Tuesdays at 4:45pm [facility] Bouska, MD; Gordon, PA; Jeffery, PA; Weber, PA [address] [place] [address] [phone] Mon-Fri 7:30-5:30 Babies seen by [facility] providers [facility] [address], [place], [place]  [address] [phone]   Fax - [phone]  [place] ([address] & [address]) [facility] [place] [address] [place], [place] [address] [phone] Mon-Thur 8:00-6:00 Providers come to see babies at [facility] Accepting Medicaid [facility] Anderson, NP; Badger, MD; Beal, PA; Spencer, PA [address] [place], [place] [address] [phone] Mon-Thur 7:30-7:30, Fri 7:30-4:30 Babies seen by [facility] providers Accepting Medicaid [facility] Agbuya, MD; Klett, NP; Romgoolam, MD [address], [place], [place] [address] [phone] Mon-Fri 8:30-5:00, Sat 8:30-12:00 Providers come to see babies at [facility] Accepting Medicaid Must have "Meet & Greet" appointment at office prior to delivery [facility] ([facility] McCord, MD; Wallace, MD; Wood, MD [address], [place], [place] [address] [phone] Mon-Wed 8:00-6:00, Thur-Fri 8:00-5:00, Sat 9:00-12:00 Providers come to see babies at [facility] Does NOT accept Medicaid Only accepting siblings of current patients [facility]  [address], [place], [place]  [address] [phone]   Fax - [phone] [facility] [address], [place], [place]  [address] [phone]   Fax -  [phone]  [place] ([address] & [address]) [facility] Cirigliano, DO; Matthews, DO [address] , [place] [address] [phone] Mon-Fri 7:00-5:00 Babies seen by [facility] providers Does NOT accept Medicaid [facility] Briscoe, MD; Howley, PA; Moreira, PA [address], [place], [place] [address] [phone] Mon-Fri 8:00-5:00 Babies seen by [facility] providers Accepting Medicaid [facility] Boyd, MD; Church, PA; Jones, NP; Osborn, PA [address], ,  [address] [phone] Mon-Fri 8:00-5:00 Babies seen by providers at [facility] Accepting Medicaid  [place] ([address]) [facility] [place] Wendling, DO [address] [place], [place] [address] [phone] Mon-Fri 8:00-5:00 Babies seen by [facility] providers Does NOT accept Medicaid Limited availability, please call early in hospitalization to schedule follow-up [facility] Calderon, PA; Cummings, MD; Dillard, MD; Martin, PA; Olson, MD; VanDeven, PA [address], [place], [place] [address] [phone] Mon-Fri 8:30-5:00, Sat 9:00-12:00 Babies seen by providers at [facility] Accepting Medicaid Please register online then schedule online or call office [email or web address]) Hunter, NP; Kumar, MD; Martin Rogers, PA [address], [place], [place]  [address] [phone] Mon-Fri 8:00-5:00 Babies seen by providers at [facility] Accepting Medicaid [facility] ([facility]) Shepherd, MD; Kristi Fleenor, NP; West, MD [address], [place], [place] [address] [phone] Mon-Fri 8:00-5:30, Sat&Sun by appointment (phones open at  8:30) Babies seen by [facility] providers Accepting Medicaid Must be a first-time baby or sibling of current patient [facility] [place]  [address], [place], [place]  [address] [phone]   Fax - [phone]  [place] ([address]) [facility] Brown, PA; Cowen, PA; Rice, MD; Helton, PA; Spry, MD [address] [place], [place] [address] [phone] Mon-Thur 8:00-7:00, Fri 8:00-5:00, Sat 8:00-12:00, Sun 9:00-12:00 Babies seen by [facility] providers Accepting Medicaid [facility] Coe-Goins, MD; Marshall, MD; Pierre-Louis, MD [address], [place], [place] [address] [phone] Mon-Thur 8:00-5:00 Babies seen by providers at [facility] Accepting Medicaid [facility] Bratton, MD; Coe-Goins, MD; Hayes, MD; Lewis, MD; List, MD; Lott, MD; Marshall, MD; Moran, MD; O'Neal, MD; Pierre-Louis, MD; Pitonzo, MD; Scholer, MD; Spangle, MD [address] [place], [place] [address] [phone] Mon-Fri 8:00-5:30, Sat (Oct.-Mar.) 9:00-1:00 Babies seen by providers at [facility] Accepting Medicaid Must fill out new patient packet, available online at [email or web address] [facility] ([facility]) Friddle, NP; Harris, NP; Kelly, NP; Logan, MD; Melvin, PA; Poth, MD; Ramadoss, MD; Stanton, NP [address], [place], [place] [address] [phone] Mon-Thur 8:00-5:30, Fri 8:00-5:00 Babies seen by providers at [facility] Accepting Medicaid  [place] ([address]) [facility] Dixon, PA; Damascus, MD; Pickard, MD; Tapia, PA [address], [place], [place] [address] [phone] Mon-Fri 8:00-5:00 Babies seen by providers at [facility] Accepting Medicaid   [place] ([address]) [facility] [place] Masneri, DO; Meyers, MD; Nelson, PA [address], [place], [place] [address] [phone] Mon-Fri 8:00-5:00 Babies seen by providers at [facility] Does NOT accept Medicaid Limited appointment availability, please call early in hospitalization  [facility] [place] Kunedd, DO; McGowen, MD [address], [place], [place] [address] [phone] Mon-Fri 8:00-5:00 Babies seen by [facility] providers Does NOT accept Medicaid [facility] [place] Cameron, MD; MacDonald, MD; Michaels, PA; Nayak, MD [address] [place] [address], [place], [place] [address] [phone] Mon-Fri 8:00-5:00 After hours clinic ([address] [place], [place] [address]) [phone] Mon-Fri 5:00-8:00, Sat 12:00-6:00, Sun 10:00-4:00 Babies seen by [facility] providers Accepting Medicaid [facility] [place] [address], [place], [place]  [address] [phone]   Fax - [phone]  [place] ([address]) [facility] [place] [facility] Andy, MD [address], [place], [place] [address] [phone] Mon-Fri 8:00-5:00 Babies seen by [facility] providers Does NOT accept Medicaid [facility] [place] ([facility] [place]) Eksir, MD [address], [place], [place] [address] [phone] Mon-Thur 8:00-7:00, Fri 8:00-5:00, Sat 8:00-12:00 Babies seen by providers at [facility] Accepting Medicaid - but does not have vaccinations in office (must be received elsewhere) Limited availability, please call early in hospitalization  [place] ([address]) [facility]  Charlene Flemming, MD [address],  [place] [address] [phone]  Fax [phone]  [facility]  Kimberly Newton, MD, Annamarie Streilein, PA, Carla Hampton, PA [address] [place] [place], [place]  [address] [phone] [facility]  [address], [place], [place] [address] [phone] [address], [place], [place] [address] [phone] (West Office)  [facility] [address], [place], [place] [address] [phone] [facility] [address] [place], [place], [place] [address] [phone] [facility] [address], [place], [place] [address] [phone] [facility] [address], [place], [place] [address] [phone] [facility] [address], [place], [place] [address] [phone] [facility] [address], [place], [place] [address] [phone] [facility]  [address], [place], [place] [address] [phone] Dr[facility] [address] [place], [place], [place] [address] [phone] [facility] [address], [place],  [address] [phone] [facility] [address], [place],  [address] [phone]  

## 2021-03-19 ENCOUNTER — Other Ambulatory Visit: Payer: Self-pay | Admitting: Advanced Practice Midwife

## 2021-03-22 ENCOUNTER — Telehealth (HOSPITAL_COMMUNITY): Payer: Self-pay | Admitting: *Deleted

## 2021-03-22 NOTE — Telephone Encounter (Signed)
Preadmission screen  

## 2021-03-24 ENCOUNTER — Inpatient Hospital Stay (HOSPITAL_COMMUNITY): Admit: 2021-03-24 | Payer: Self-pay

## 2021-03-26 ENCOUNTER — Inpatient Hospital Stay (HOSPITAL_COMMUNITY)
Admission: AD | Admit: 2021-03-26 | Discharge: 2021-03-29 | DRG: 805 | Disposition: A | Discharge status: Home / Self Care | Payer: No Typology Code available for payment source | Attending: Family Medicine | Admitting: Family Medicine

## 2021-03-26 ENCOUNTER — Inpatient Hospital Stay (HOSPITAL_COMMUNITY): Payer: No Typology Code available for payment source

## 2021-03-26 ENCOUNTER — Encounter (HOSPITAL_COMMUNITY): Payer: Self-pay | Admitting: Family Medicine

## 2021-03-26 ENCOUNTER — Inpatient Hospital Stay (HOSPITAL_COMMUNITY): Admit: 2021-03-26 | Payer: Self-pay

## 2021-03-26 ENCOUNTER — Other Ambulatory Visit: Payer: Self-pay

## 2021-03-26 DIAGNOSIS — O24419 Gestational diabetes mellitus in pregnancy, unspecified control: Secondary | ICD-10-CM | POA: Diagnosis present

## 2021-03-26 DIAGNOSIS — O99893 Other specified diseases and conditions complicating puerperium: Secondary | ICD-10-CM | POA: Diagnosis not present

## 2021-03-26 DIAGNOSIS — Z2839 Other underimmunization status: Secondary | ICD-10-CM

## 2021-03-26 DIAGNOSIS — O9952 Diseases of the respiratory system complicating childbirth: Secondary | ICD-10-CM | POA: Diagnosis present

## 2021-03-26 DIAGNOSIS — O2340 Unspecified infection of urinary tract in pregnancy, unspecified trimester: Secondary | ICD-10-CM | POA: Diagnosis present

## 2021-03-26 DIAGNOSIS — J45909 Unspecified asthma, uncomplicated: Secondary | ICD-10-CM | POA: Diagnosis present

## 2021-03-26 DIAGNOSIS — Z20822 Contact with and (suspected) exposure to covid-19: Secondary | ICD-10-CM | POA: Diagnosis present

## 2021-03-26 DIAGNOSIS — R339 Retention of urine, unspecified: Secondary | ICD-10-CM | POA: Diagnosis not present

## 2021-03-26 DIAGNOSIS — O480 Post-term pregnancy: Secondary | ICD-10-CM | POA: Diagnosis present

## 2021-03-26 DIAGNOSIS — O24410 Gestational diabetes mellitus in pregnancy, diet controlled: Secondary | ICD-10-CM | POA: Diagnosis present

## 2021-03-26 DIAGNOSIS — O0990 Supervision of high risk pregnancy, unspecified, unspecified trimester: Secondary | ICD-10-CM

## 2021-03-26 DIAGNOSIS — O9942 Diseases of the circulatory system complicating childbirth: Secondary | ICD-10-CM | POA: Diagnosis present

## 2021-03-26 DIAGNOSIS — O09899 Supervision of other high risk pregnancies, unspecified trimester: Secondary | ICD-10-CM

## 2021-03-26 DIAGNOSIS — I471 Supraventricular tachycardia: Secondary | ICD-10-CM | POA: Diagnosis present

## 2021-03-26 DIAGNOSIS — O99824 Streptococcus B carrier state complicating childbirth: Secondary | ICD-10-CM | POA: Diagnosis present

## 2021-03-26 DIAGNOSIS — O99513 Diseases of the respiratory system complicating pregnancy, third trimester: Secondary | ICD-10-CM | POA: Diagnosis present

## 2021-03-26 DIAGNOSIS — O24420 Gestational diabetes mellitus in childbirth, diet controlled: Secondary | ICD-10-CM | POA: Diagnosis present

## 2021-03-26 DIAGNOSIS — Z3A40 40 weeks gestation of pregnancy: Secondary | ICD-10-CM

## 2021-03-26 DIAGNOSIS — B951 Streptococcus, group B, as the cause of diseases classified elsewhere: Secondary | ICD-10-CM | POA: Diagnosis present

## 2021-03-26 HISTORY — DX: Syncope and collapse: R55

## 2021-03-26 HISTORY — DX: Tachycardia, unspecified: R00.0

## 2021-03-26 LAB — RESP PANEL BY RT-PCR (FLU A&B, COVID) ARPGX2
Influenza A by PCR: NEGATIVE
Influenza B by PCR: NEGATIVE
SARS Coronavirus 2 by RT PCR: NEGATIVE

## 2021-03-26 LAB — CBC
HCT: 34.5 % — ABNORMAL LOW (ref 36.0–46.0)
Hemoglobin: 11.2 g/dL — ABNORMAL LOW (ref 12.0–15.0)
MCH: 27 pg (ref 26.0–34.0)
MCHC: 32.5 g/dL (ref 30.0–36.0)
MCV: 83.1 fL (ref 80.0–100.0)
Platelets: 162 10*3/uL (ref 150–400)
RBC: 4.15 MIL/uL (ref 3.87–5.11)
RDW: 14.6 % (ref 11.5–15.5)
WBC: 11.6 10*3/uL — ABNORMAL HIGH (ref 4.0–10.5)
nRBC: 0 % (ref 0.0–0.2)

## 2021-03-26 LAB — GLUCOSE, CAPILLARY
Glucose-Capillary: 107 mg/dL — ABNORMAL HIGH (ref 70–99)
Glucose-Capillary: 106 mg/dL — ABNORMAL HIGH (ref 70–99)
Glucose-Capillary: 78 mg/dL (ref 70–99)
Glucose-Capillary: 99 mg/dL (ref 70–99)

## 2021-03-26 LAB — TYPE AND SCREEN
ABO/RH(D): A POS
Antibody Screen: NEGATIVE

## 2021-03-26 MED ORDER — OXYTOCIN-SODIUM CHLORIDE 30-0.9 UT/500ML-% IV SOLN
2.5000 [IU]/h | INTRAVENOUS | Status: DC
  Administered 2021-03-27: 2.5 [IU]/h via INTRAVENOUS

## 2021-03-26 MED ORDER — PHENYLEPHRINE 40 MCG/ML (10ML) SYRINGE FOR IV PUSH (FOR BLOOD PRESSURE SUPPORT): 80.0000 ug | PREFILLED_SYRINGE | INTRAVENOUS | Status: DC | PRN

## 2021-03-26 MED ORDER — PENICILLIN G POT IN DEXTROSE 60000 UNIT/ML IV SOLN
3.0000 10*6.[IU] | INTRAVENOUS | Status: DC
Start: 2021-03-26 — End: 2021-03-26
  Administered 2021-03-26 (×2): 3 10*6.[IU] via INTRAVENOUS
  Filled 2021-03-26 (×2): qty 50

## 2021-03-26 MED ORDER — MISOPROSTOL 50MCG HALF TABLET
50.0000 ug | ORAL_TABLET | ORAL | Status: DC | PRN
  Administered 2021-03-26 (×2): 50 ug via BUCCAL
  Filled 2021-03-26 (×2): qty 1

## 2021-03-26 MED ORDER — LIDOCAINE HCL (PF) 1 % IJ SOLN
30.0000 mL | INTRAMUSCULAR | Status: AC | PRN
  Administered 2021-03-27: 30 mL via SUBCUTANEOUS
  Filled 2021-03-26: qty 30

## 2021-03-26 MED ORDER — DIPHENHYDRAMINE HCL 50 MG/ML IJ SOLN: 12.5000 mg | INTRAMUSCULAR | Status: DC | PRN

## 2021-03-26 MED ORDER — FENTANYL-BUPIVACAINE-NACL 0.5-0.125-0.9 MG/250ML-% EP SOLN
12.0000 mL/h | EPIDURAL | Status: DC | PRN
  Administered 2021-03-27: 12 mL/h via EPIDURAL
  Filled 2021-03-26: qty 250

## 2021-03-26 MED ORDER — ACETAMINOPHEN 325 MG PO TABS: 650.0000 mg | ORAL_TABLET | ORAL | Status: DC | PRN

## 2021-03-26 MED ORDER — PENICILLIN G POTASSIUM 5000000 UNITS IJ SOLR
5.0000 10*6.[IU] | Freq: Once | INTRAMUSCULAR | Status: AC
  Administered 2021-03-26: 5 10*6.[IU] via INTRAVENOUS
  Filled 2021-03-26: qty 5

## 2021-03-26 MED ORDER — LACTATED RINGERS IV SOLN
500.0000 mL | INTRAVENOUS | Status: DC | PRN
Start: 2021-03-26 — End: 2021-03-27
  Administered 2021-03-27 (×2): 500 mL via INTRAVENOUS

## 2021-03-26 MED ORDER — OXYCODONE-ACETAMINOPHEN 5-325 MG PO TABS: 2.0000 | ORAL_TABLET | ORAL | Status: DC | PRN

## 2021-03-26 MED ORDER — MISOPROSTOL 25 MCG QUARTER TABLET
25.0000 ug | ORAL_TABLET | ORAL | Status: DC | PRN
  Filled 2021-03-26: qty 1

## 2021-03-26 MED ORDER — LACTATED RINGERS IV SOLN: INTRAVENOUS | Status: DC

## 2021-03-26 MED ORDER — TERBUTALINE SULFATE 1 MG/ML IJ SOLN: 0.2500 mg | Freq: Once | INTRAMUSCULAR | Status: DC | PRN

## 2021-03-26 MED ORDER — PENICILLIN G POT IN DEXTROSE 60000 UNIT/ML IV SOLN
3.0000 10*6.[IU] | INTRAVENOUS | Status: DC
  Administered 2021-03-26 – 2021-03-27 (×5): 3 10*6.[IU] via INTRAVENOUS
  Filled 2021-03-26 (×8): qty 50

## 2021-03-26 MED ORDER — OXYTOCIN BOLUS FROM INFUSION
333.0000 mL | Freq: Once | INTRAVENOUS | Status: AC
  Administered 2021-03-27: 333 mL via INTRAVENOUS

## 2021-03-26 MED ORDER — FENTANYL CITRATE (PF) 100 MCG/2ML IJ SOLN
INTRAMUSCULAR | Status: AC
  Administered 2021-03-26: 100 ug via INTRAVENOUS
  Filled 2021-03-26: qty 2

## 2021-03-26 MED ORDER — EPHEDRINE 5 MG/ML INJ: 10.0000 mg | INTRAVENOUS | Status: DC | PRN

## 2021-03-26 MED ORDER — LACTATED RINGERS IV SOLN
500.0000 mL | Freq: Once | INTRAVENOUS | Status: AC
  Administered 2021-03-27: 500 mL via INTRAVENOUS

## 2021-03-26 MED ORDER — ONDANSETRON HCL 4 MG/2ML IJ SOLN
4.0000 mg | Freq: Four times a day (QID) | INTRAMUSCULAR | Status: DC | PRN
  Administered 2021-03-27: 4 mg via INTRAVENOUS
  Filled 2021-03-26: qty 2

## 2021-03-26 MED ORDER — OXYCODONE-ACETAMINOPHEN 5-325 MG PO TABS: 1.0000 | ORAL_TABLET | ORAL | Status: DC | PRN

## 2021-03-26 MED ORDER — SOD CITRATE-CITRIC ACID 500-334 MG/5ML PO SOLN: 30.0000 mL | ORAL | Status: DC | PRN

## 2021-03-26 MED ORDER — FENTANYL CITRATE (PF) 100 MCG/2ML IJ SOLN
50.0000 ug | INTRAMUSCULAR | Status: DC | PRN
Start: 2021-03-26 — End: 2021-03-27
  Administered 2021-03-26: 50 ug via INTRAVENOUS
  Filled 2021-03-26: qty 2

## 2021-03-26 MED ORDER — FENTANYL CITRATE (PF) 100 MCG/2ML IJ SOLN: 100.0000 ug | INTRAMUSCULAR | Status: DC | PRN

## 2021-03-26 NOTE — H&P (Addendum)
OBSTETRIC ADMISSION HISTORY AND PHYSICAL  Wendy Kidd is a 25 y.o. female G2P0010 with IUP at [redacted]w[redacted]d by LMP presenting for IOL for A1GDM. She reports +FMs, No LOF, no VB.  She plans on breast feeding. She requests Nexplanon outpatient for birth control. She received her [facility].   Dating: By LMP --->  Estimated Date of Delivery: 03/26/21 Sono:  @[redacted]w[redacted]d , ventriculomegaly that resolved, otherwise normal anatomy, cephalic presentation, posterior placenta, 2488g, 60% EFW  Prenatal History/Complications: Syncope, paroxysmal SVT seen by cardiology in pregnancy, GBS UTI in 3rd trimester  Past Medical History: Past Medical History:  Diagnosis Date   Asthma    History of ectopic pregnancy 08/08/2020   Medical history non-contributory    Syncopal episodes    Tachycardia    paroxysmal supraventricular tachycardia - sees cardiologist, diagnosed in pregnancy    Past Surgical History: Past Surgical History:  Procedure Laterality Date   DIAGNOSTIC LAPAROSCOPY WITH REMOVAL OF ECTOPIC PREGNANCY Right 04/15/2020   Procedure: DIAGNOSTIC LAPAROSCOPY WITH REMOVAL OF ECTOPIC PREGNANCY;  Surgeon: Donnamae Jude, MD;  Location: [facility];  Service: Gynecology;  Laterality: Right;    Obstetrical History: OB History     Gravida  2   Para  0   Term  0   Preterm  0   AB  1   Living  0      SAB  0   IAB      Ectopic  1   Multiple  0   Live Births  0           Social History Social History   Socioeconomic History   Marital status: Married    Spouse name: Joslen   Number of children: Not on file   Years of education: Not on file   Highest education level: Not on file  Occupational History   Not on file  Tobacco Use   Smoking status: Never   Smokeless tobacco: Never  Vaping Use   Vaping Use: Never used  Substance and Sexual Activity   Alcohol use: Never   Drug use: Never   Sexual activity: Yes  Other Topics Concern   Not on file  Social History Narrative   Not on  file   Social Determinants of Health   Financial Resource Strain: Not on file  Food Insecurity: No Food Insecurity   Worried About Running Out of Food in the Last Year: Never true   Ran Out of Food in the Last Year: Never true  Transportation Needs: No Transportation Needs   Lack of Transportation (Medical): No   Lack of Transportation (Non-Medical): No  Physical Activity: Not on file  Stress: Not on file  Social Connections: Not on file    Family History: Family History  Problem Relation Age of Onset   Asthma Mother     Allergies: No Known Allergies  Medications Prior to Admission  Medication Sig Dispense Refill Last Dose   Accu-Chek Softclix Lancets lancets 1 each by Other route 4 (four) times daily. To check blood sugars 4 times a day. Fasting before breakfast and 2 hours after breakfast, lunch and dinner 100 each 12    calcium carbonate (TUMS - DOSED IN MG ELEMENTAL CALCIUM) 500 MG chewable tablet Chew 2 tablets by mouth at bedtime as needed for indigestion or heartburn.      fluticasone-salmeterol (ADVAIR HFA) 45-21 MCG/ACT inhaler Inhale 2 puffs into the lungs 2 (two) times daily. (Patient taking differently: Inhale 2 puffs into the lungs  2 (two) times daily as needed (sob).) 1 each 12    glucose blood (ACCU-CHEK GUIDE) test strip To use to check blood sugars 4 times a day. 100 each 12    montelukast (SINGULAIR) 10 MG tablet Take 1 tablet (10 mg total) by mouth at bedtime. 30 tablet 1    Prenatal Vit-Fe Fumarate-FA (PREPLUS) 27-1 MG TABS Take 1 tablet by mouth daily.       Review of Systems  All systems reviewed and negative except as stated in HPI  Blood pressure 120/78, pulse 87, temperature 98.2 F (36.8 C), temperature source Oral, resp. rate 16, height 5\' 8"  (1.727 m), weight 83.1 kg, last menstrual period 06/19/2020, currently breastfeeding. General appearance: alert and cooperative Lungs: clear to auscultation bilaterally Heart: regular rate and rhythm Abdomen:  soft, non-tender Extremities: Homans sign is negative, no sign of DVT Presentation: cephalic Fetal monitoring: Baseline: 140 bpm, Variability: Good {> 6 bpm), Accelerations: Reactive, and Decelerations: Absent Uterine activity: Frequency: Every 4-5 minutes Dilation: Closed Effacement (%): Thick Station: -2 Presentation: Vertex Exam by:: [email or web address], RN   Prenatal labs: ABO, Rh: --/--/PENDING (11/08 0930) Antibody: PENDING (11/08 0930) Rubella: Immune (03/23 0000)Immune RPR: Non Reactive (08/31 0903)  HBsAg: Negative (03/23 0000) Non reactive HIV: Non Reactive (08/31 0903)  GBS: Positive/-- (08/04 0000)  GTT: 3rd trimester abnormal 2hr 153 Genetic screening: Normal Anatomy 08-08-2004: Ventriculomegaly that resolved, otherwise normal  Prenatal Transfer Tool  Maternal Diabetes: Yes:  Diabetes Type:  Diet controlled Genetic Screening: Normal Maternal Ultrasounds/Referrals: Other: cerebral ventriculomegaly > resolved Fetal Ultrasounds or other Referrals:  Referred to Materal Fetal Medicine  Maternal Substance Abuse:  No Significant Maternal Medications:  None Significant Maternal Lab Results: Group B Strep positive  Results for orders placed or performed during the hospital encounter of 03/26/21 (from the past 24 hour(s))  Glucose, capillary   Collection Time: 03/26/21  9:22 AM  Result Value Ref Range   Glucose-Capillary 107 (H) 70 - 99 mg/dL  CBC   Collection Time: 03/26/21  9:30 AM  Result Value Ref Range   WBC 11.6 (H) 4.0 - 10.5 K/uL   RBC 4.15 3.87 - 5.11 MIL/uL   Hemoglobin 11.2 (L) 12.0 - 15.0 g/dL   HCT 13/08/22 (L) 02.5 - 42.7 %   MCV 83.1 80.0 - 100.0 fL   MCH 27.0 26.0 - 34.0 pg   MCHC 32.5 30.0 - 36.0 g/dL   RDW 06.2 37.6 - 28.3 %   Platelets 162 150 - 400 K/uL   nRBC 0.0 0.0 - 0.2 %  Type and screen   Collection Time: 03/26/21  9:30 AM  Result Value Ref Range   ABO/RH(D) PENDING    Antibody Screen PENDING    Sample Expiration      03/29/2021,2359 Performed at [facility], [address], [place], [place]     Patient Active Problem List   Diagnosis Date Noted   Gestational diabetes mellitus, class A1 03/26/2021   Syncope 02/26/2021   Asthma affecting pregnancy in third trimester 02/11/2021   Gestational diabetes 01/17/2021   GBS (group B streptococcus) UTI complicating pregnancy 01/16/2021   Abnormal fetal ultrasound 01/16/2021   Supervision of high risk pregnancy, antepartum 12/20/2020   Maternal varicella, non-immune 08/13/2020    Assessment/Plan:  Deryn Massengale is a 25 y.o. G2P0010 at [redacted]w[redacted]d here for IOL for A1GDM.  #Labor: Induction. Start with Cytotec (buccal).  #Pain: PRN. Interested in epidural when further along. #Fetal Well Being: Category I #ID: GBS UTI  in 3rd trimester > PCN  #Method Of Feeding: breast feeding #Method Of Contraception:  Nexplanon outpatient #Circ: N/A  #A1GDM: q4h CBG checks  Wells Guiles, DO 03/26/2021, 10:33 AM PGY-1, [place] of Attending Supervision of Resident: Evaluation and management procedures were performed by the [facility] Resident under my supervision. I was immediately available for direct supervision, assistance and direction throughout this encounter.  I also confirm that I have verified the information documented in the resident's note, and that I have also personally reperformed the pertinent components of the physical exam and all of the medical decision making activities.    Katherine Basset, DO OB [facility] for [employer], [facility] 03/26/2021  11:01 AM

## 2021-03-26 NOTE — Progress Notes (Signed)
Patient ID: Wendy Kidd, female   DOB: 1996/02/12, 25 y.o.   MRN: [id number] Doing well, walking the halls  Vitals:   03/26/21 1418 03/26/21 1849 03/26/21 1850 03/26/21 1954  BP: 116/66  120/72 110/77  Pulse: 81  97 100  Resp: 16 18  16   Temp: 97.6 F (36.4 C) 98 F (36.7 C)  98 F (36.7 C)  TempSrc: Oral Oral  Oral  Weight:      Height:       FHR has been stable UCs q2-68min  Cervical exam deferred  Will continue plan of care, more cytotec if UCs space out.

## 2021-03-26 NOTE — Progress Notes (Signed)
LABOR PROGRESS NOTE  Mikella Linsley is a 25 y.o. G2P0010 at [redacted]w[redacted]d  presented for IOL for A1GDM.  Subjective: Feeling more uncomfortable with contractions.  Objective: BP 120/72   Pulse 97   Temp 98 F (36.7 C) (Oral)   Resp 18   Ht 5\' 8"  (1.727 m)   Wt 83.1 kg   LMP 06/19/2020 (Exact Date)   BMI 27.84 kg/m  or  Vitals:   03/26/21 1303 03/26/21 1418 03/26/21 1849 03/26/21 1850  BP: 108/72 116/66  120/72  Pulse: 78 81  97  Resp:  16 18   Temp:  97.6 F (36.4 C) 98 F (36.7 C)   TempSrc:  Oral Oral   Weight:      Height:        Dilation: 2 Effacement (%): 50 Station: -1 Presentation: Vertex Exam by:: Dr. [email or web address] Fetal monitoring: Baseline: 145 bpm, Variability: Good {> 6 bpm), Accelerations: present, and Decelerations: Absent Uterine activity: Frequency: Every 2 minutes  Labs: Lab Results  Component Value Date   WBC 11.6 (H) 03/26/2021   HGB 11.2 (L) 03/26/2021   HCT 34.5 (L) 03/26/2021   MCV 83.1 03/26/2021   PLT 162 03/26/2021    Patient Active Problem List   Diagnosis Date Noted   Gestational diabetes mellitus, class A1 03/26/2021   Syncope 02/26/2021   Asthma affecting pregnancy in third trimester 02/11/2021   Gestational diabetes 01/17/2021   GBS (group B streptococcus) UTI complicating pregnancy 01/16/2021   Abnormal fetal ultrasound 01/16/2021   Supervision of high risk pregnancy, antepartum 12/20/2020   Maternal varicella, non-immune 08/13/2020    Assessment / Plan: 25 y.o. G2P0010 at [redacted]w[redacted]d here for IOL for A1GDM.  Labor: Slowly progressing as expected given nulliparous status. S/p cytotec x2. Pt declined FB at this time. Hold cytotec given contraction frequency. Consider half dose cytotec if contractions space out. Fetal Wellbeing:  Category I Pain Control:  PRN. Epidural when necessary. Anticipated MOD:  Vaginal #GBS positive>PCN #A1GDM: last CBG 78. Continue q4h checks.  [redacted]w[redacted]d, DO 03/26/2021, 7:16 PM PGY-1, [facility]

## 2021-03-26 NOTE — Progress Notes (Signed)
Labor Progress Note Wendy Kidd is a 25 y.o. G2P0010 at [redacted]w[redacted]d admitted for IOL for A1GDM.  S: Walking the halls with doula and mother as support, breathing through contractions.  O:  BP 110/77   Pulse 100   Temp 98 F (36.7 C) (Oral)   Resp 16   Ht 5\' 8"  (1.727 m)   Wt 83.1 kg   LMP 06/19/2020 (Exact Date)   BMI 27.84 kg/m  EFM: 130 baseline /moderate variability /accels present, no decels  CVE: Dilation: 2 Effacement (%): 50 Station: -1 Presentation: Vertex Exam by:: Dr. [email or web address]   A&P: 25 y.o. G2P0010 [redacted]w[redacted]d admitted for IOL for A1GDM. #Labor: Progressing well. Cervical check deferred. Currently has good contraction pattern. #Pain: As needed. Currently coping well with breathing and other supportive techniques. Epidural if needed. #FWB: Category I #GBS positive. PCN ordered. #A1GDM: Recent CBG 78. Continue q4hr checks.  [redacted]w[redacted]d, MD 9:35 PM

## 2021-03-26 NOTE — Progress Notes (Signed)
LABOR PROGRESS NOTE  Wendy Kidd is a 25 y.o. G2P0010 at [redacted]w[redacted]d  presented for IOL for A1GDM.  Subjective: Patient is comfortable, feeling contractions regularly.  Objective: BP 116/66   Pulse 81   Temp 97.6 F (36.4 C) (Oral)   Resp 16   Ht 5\' 8"  (1.727 m)   Wt 83.1 kg   LMP 06/19/2020 (Exact Date)   BMI 27.84 kg/m  or  Vitals:   03/26/21 0917 03/26/21 1003 03/26/21 1303 03/26/21 1418  BP:  120/78 108/72 116/66  Pulse:  87 78 81  Resp:  16  16  Temp:    97.6 F (36.4 C)  TempSrc:    Oral  Weight: 83.1 kg     Height: 5\' 8"  (1.727 m)      Dilation: 1 Effacement (%): Thick Station: -2 Presentation: Vertex Exam by:: Dr. Fetal monitoring: Baseline: 150 bpm, Variability: Good {> 6 bpm), Accelerations: present, and Decelerations: Absent Uterine activity: Frequency: Every 2 minutes  Labs: Lab Results  Component Value Date   WBC 11.6 (H) 03/26/2021   HGB 11.2 (L) 03/26/2021   HCT 34.5 (L) 03/26/2021   MCV 83.1 03/26/2021   PLT 162 03/26/2021    Patient Active Problem List   Diagnosis Date Noted   Gestational diabetes mellitus, class A1 03/26/2021   Syncope 02/26/2021   Asthma affecting pregnancy in third trimester 02/11/2021   Gestational diabetes 01/17/2021   GBS (group B streptococcus) UTI complicating pregnancy 01/16/2021   Abnormal fetal ultrasound 01/16/2021   Supervision of high risk pregnancy, antepartum 12/20/2020   Maternal varicella, non-immune 08/13/2020    Assessment / Plan: 25 y.o. G2P0010 at [redacted]w[redacted]d here for IOL for A1GDM.  Labor: Progressing slowly as expected for prime. 2nd dose of buccal cytotec given. Discussed foley balloon for the future.  Fetal Wellbeing:  Category I Pain Control:  PRN. Epidural when necessary. Anticipated MOD:  Vaginal #GBS positive > PCN #A1GDM:  Recent CBG 106. Continue q4h checks.  30, DO 03/26/2021, 2:57 PM PGY-1, [facility]

## 2021-03-27 ENCOUNTER — Inpatient Hospital Stay (HOSPITAL_COMMUNITY): Payer: No Typology Code available for payment source | Admitting: Anesthesiology

## 2021-03-27 ENCOUNTER — Encounter (HOSPITAL_COMMUNITY): Payer: Self-pay | Admitting: Family Medicine

## 2021-03-27 DIAGNOSIS — O99824 Streptococcus B carrier state complicating childbirth: Secondary | ICD-10-CM

## 2021-03-27 DIAGNOSIS — O24420 Gestational diabetes mellitus in childbirth, diet controlled: Secondary | ICD-10-CM

## 2021-03-27 LAB — GLUCOSE, CAPILLARY
Glucose-Capillary: 97 mg/dL (ref 70–99)
Glucose-Capillary: 86 mg/dL (ref 70–99)
Glucose-Capillary: 84 mg/dL (ref 70–99)
Glucose-Capillary: 90 mg/dL (ref 70–99)
Glucose-Capillary: 98 mg/dL (ref 70–99)

## 2021-03-27 LAB — RPR: RPR Ser Ql: NONREACTIVE

## 2021-03-27 MED ORDER — TERBUTALINE SULFATE 1 MG/ML IJ SOLN: 0.2500 mg | Freq: Once | INTRAMUSCULAR | Status: DC | PRN

## 2021-03-27 MED ORDER — PRENATAL MULTIVITAMIN CH
1.0000 | ORAL_TABLET | Freq: Every day | ORAL | Status: DC
  Administered 2021-03-28: 1 via ORAL
  Filled 2021-03-27 (×2): qty 1

## 2021-03-27 MED ORDER — ONDANSETRON HCL 4 MG/2ML IJ SOLN: 4.0000 mg | INTRAMUSCULAR | Status: DC | PRN

## 2021-03-27 MED ORDER — WITCH HAZEL-GLYCERIN EX PADS: 1.0000 "application " | MEDICATED_PAD | CUTANEOUS | Status: DC | PRN

## 2021-03-27 MED ORDER — IBUPROFEN 600 MG PO TABS
600.0000 mg | ORAL_TABLET | Freq: Four times a day (QID) | ORAL | Status: DC
  Administered 2021-03-27 – 2021-03-29 (×6): 600 mg via ORAL
  Filled 2021-03-27 (×7): qty 1

## 2021-03-27 MED ORDER — SIMETHICONE 80 MG PO CHEW
80.0000 mg | CHEWABLE_TABLET | ORAL | Status: DC | PRN
  Administered 2021-03-29 (×2): 80 mg via ORAL
  Filled 2021-03-27 (×2): qty 1

## 2021-03-27 MED ORDER — DIBUCAINE (PERIANAL) 1 % EX OINT: 1.0000 "application " | TOPICAL_OINTMENT | CUTANEOUS | Status: DC | PRN

## 2021-03-27 MED ORDER — LIDOCAINE HCL (PF) 1 % IJ SOLN
INTRAMUSCULAR | Status: DC | PRN
  Administered 2021-03-27 (×2): 4 mL via EPIDURAL

## 2021-03-27 MED ORDER — OXYTOCIN-SODIUM CHLORIDE 30-0.9 UT/500ML-% IV SOLN
1.0000 m[IU]/min | INTRAVENOUS | Status: DC
  Administered 2021-03-27 (×2): 2 m[IU]/min via INTRAVENOUS
  Administered 2021-03-27: 4 m[IU]/min via INTRAVENOUS
  Filled 2021-03-27: qty 500

## 2021-03-27 MED ORDER — OXYCODONE HCL 5 MG PO TABS: 10.0000 mg | ORAL_TABLET | ORAL | Status: DC | PRN

## 2021-03-27 MED ORDER — INFLUENZA VAC SPLIT QUAD 0.5 ML IM SUSY: 0.5000 mL | PREFILLED_SYRINGE | INTRAMUSCULAR | Status: DC

## 2021-03-27 MED ORDER — LACTATED RINGERS IV SOLN: 500.0000 mL | Freq: Once | INTRAVENOUS | Status: AC

## 2021-03-27 MED ORDER — DIPHENHYDRAMINE HCL 25 MG PO CAPS: 25.0000 mg | ORAL_CAPSULE | Freq: Four times a day (QID) | ORAL | Status: DC | PRN

## 2021-03-27 MED ORDER — DOCUSATE SODIUM 100 MG PO CAPS
100.0000 mg | ORAL_CAPSULE | Freq: Two times a day (BID) | ORAL | Status: DC
  Administered 2021-03-28 (×2): 100 mg via ORAL
  Filled 2021-03-27 (×3): qty 1

## 2021-03-27 MED ORDER — ACETAMINOPHEN 325 MG PO TABS: 650.0000 mg | ORAL_TABLET | ORAL | Status: DC | PRN

## 2021-03-27 MED ORDER — ONDANSETRON HCL 4 MG PO TABS: 4.0000 mg | ORAL_TABLET | ORAL | Status: DC | PRN

## 2021-03-27 MED ORDER — OXYCODONE HCL 5 MG PO TABS: 5.0000 mg | ORAL_TABLET | ORAL | Status: DC | PRN

## 2021-03-27 MED ORDER — BENZOCAINE-MENTHOL 20-0.5 % EX AERO
1.0000 "application " | INHALATION_SPRAY | CUTANEOUS | Status: DC | PRN
  Administered 2021-03-28: 1 via TOPICAL
  Filled 2021-03-27: qty 56

## 2021-03-27 MED ORDER — COCONUT OIL OIL: 1.0000 "application " | TOPICAL_OIL | Status: DC | PRN

## 2021-03-27 NOTE — Anesthesia Preprocedure Evaluation (Signed)
Anesthesia Evaluation  Patient identified by MRN, date of birth, ID band Patient awake    Reviewed: Allergy & Precautions, Patient's Chart, lab work & pertinent test results  History of Anesthesia Complications Negative for: history of anesthetic complications  Airway Mallampati: II  TM Distance: >3 FB Neck ROM: Full    Dental no notable dental hx.    Pulmonary asthma ,    Pulmonary exam normal        Cardiovascular Normal cardiovascular exam+ dysrhythmias Supra Ventricular Tachycardia      Neuro/Psych negative neurological ROS  negative psych ROS   GI/Hepatic negative GI ROS, Neg liver ROS,   Endo/Other  diabetes, Gestational  Renal/GU negative Renal ROS  negative genitourinary   Musculoskeletal negative musculoskeletal ROS (+)   Abdominal   Peds  Hematology negative hematology ROS (+)   Anesthesia Other Findings Day of surgery medications reviewed with patient.  Reproductive/Obstetrics (+) Pregnancy                             Anesthesia Physical Anesthesia Plan  ASA: 2  Anesthesia Plan: Epidural   Post-op Pain Management:    Induction:   PONV Risk Score and Plan: Treatment may vary due to age or medical condition  Airway Management Planned: Natural Airway  Additional Equipment:   Intra-op Plan:   Post-operative Plan:   Informed Consent: I have reviewed the patients History and Physical, chart, labs and discussed the procedure including the risks, benefits and alternatives for the proposed anesthesia with the patient or authorized representative who has indicated his/her understanding and acceptance.       Plan Discussed with:   Anesthesia Plan Comments:         Anesthesia Quick Evaluation

## 2021-03-27 NOTE — Progress Notes (Signed)
Labor Progress Note Wendy Kidd is a 25 y.o. G2P0010 at [redacted]w[redacted]d presented for IOL for A1GDM.  At bedside to evaluate pt on behalf of Dr. Alvester Morin who is in OR  S:  Comfortable with epidural, actively pushing. Feels well, denies fatigue.  O:  BP 106/69   Pulse 83   Temp 98.1 F (36.7 C) (Oral)   Resp 18   Ht 5\' 8"  (1.727 m)   Wt 83.1 kg   LMP 06/19/2020 (Exact Date)   BMI 27.84 kg/m  EFM: baseline 155 bpm/ mod variability/ + accels/ variable, late decels  Toco/IUPC: 2-4 SVE: 10/+1 Pitocin: 2 mu/min  A/P: 25 y.o. G2P0010 [redacted]w[redacted]d  1. Labor: second stage 2. FWB: Cat II 3. Pain: epidural 4. A1GDM: stable  Active pushing x3 hrs, minimal progress. Has good pushing effort however baby feels LOT w/posterior asynclitism. Recommend left exaggerated sims x30-48m to assist rotation. Dr. 48m updates. Anticipate SVD.  Alvester Morin, CNM 11:45 AM

## 2021-03-27 NOTE — Lactation Note (Signed)
This note was copied from a baby's chart. Lactation Consultation Note  Patient Name: Wendy Kidd ALPFX'T Date: 03/27/2021 Reason for consult: L&D Initial assessment;Term;Primapara;1st time breastfeeding Age:23 hours   Initial L&D Consult:  Visited with family < 1 hour after birth Multiple family members present; visiting and taking photos of baby and family.  MD informed me that mother was able to latch baby for a few minutes after delivery.  Spoke with mother and she requested a lactation visit on the M/B unit rather than now in L&D.  Reassured her that lactation services will be available to assist.  Allowed time for family bonding.   Maternal Data    Feeding Mother's Current Feeding Choice: Breast Milk  LATCH Score                    Lactation Tools Discussed/Used    Interventions Interventions: Skin to skin  Discharge    Consult Status Consult Status: Follow-up from L&D    Wendy Kidd R Wendy Kidd 03/27/2021, 6:08 PM

## 2021-03-27 NOTE — Progress Notes (Signed)
Labor Progress Note Lakasha Mcfall is a 25 y.o. G2P0010 at [redacted]w[redacted]d admitted for IOL for A1GDM. S: Epidural in place, feeling much more comfortable  O:  BP 123/72   Pulse 70   Temp 98 F (36.7 C) (Oral)   Resp 16   Ht 5\' 8"  (1.727 m)   Wt 83.1 kg   LMP 06/19/2020 (Exact Date)   BMI 27.84 kg/m  EFM: baseline 140 /moderate variability/accelerations present, a few variable decelerations present  CVE: Dilation: 3 Effacement (%): 70 Station: 0 Presentation: Vertex Exam by:: [email or web address], RN   A&P: 25 y.o. G2P0010 [redacted]w[redacted]d admitted for IOL for A1GDM. #Labor: Progressing, with 1 cm dilation change since last check.  Will start pitocin 2x2 and titrate to good contraction pattern. #Pain: Epidural in place. #FWB: Category II, reassuring variability and accelerations. Has had a few variable decelerations. Will implement positional changes. #GBS positive, on PCN #A1GDM: Most recently 99. Continue q4hr checks.  [redacted]w[redacted]d, MD 1:13 AM

## 2021-03-27 NOTE — Discharge Summary (Signed)
error 

## 2021-03-27 NOTE — Progress Notes (Signed)
Labor Progress Note Wendy Kidd is a 25 y.o. G2P0010 at [redacted]w[redacted]d admitted for IOL for A1GDM.  S: Called to bedside as FHR exhibiting recurrent late decels. Positional changes, LR bolus initiated and pitocin paused with good fetal response. Patient resting comfortably in bed.  O:  BP (!) 104/55   Pulse (!) 56   Temp 98 F (36.7 C) (Oral)   Resp 16   Ht 5\' 8"  (1.727 m)   Wt 83.1 kg   LMP 06/19/2020 (Exact Date)   BMI 27.84 kg/m  EFM: baseline 145 /moderate variability /accelerations present. Recurrent late decelerations exhibited for about 8 contractions. Responded to positional change, bolus, and pause in pitocin. Previously also had some late decelerations that responded to positional change.  CVE: Dilation: 3 Effacement (%): 70 Cervical Position: Posterior Station: 0 Presentation: Vertex Exam by:: [email or web address], RN   A&P: 25 y.o. G2P0010 [redacted]w[redacted]d admitted for IOL for A1GDM. #Labor: Pitocin paused to enable recovery time after recurrent lates. FHR recovered well in response to positional change, LR bolus, and pausing of pitocin. Pitocin was at rate of 4 previously. After half an hour will recheck and reinstate pitocin if tolerated. #Pain: Well controlled on epidural #FWB: Category II. As above, will suspend pitocin for half an hour to allow for fetal recovery. Will continue implementing positional changes as needed. #GBS positive, on PCN #A1GDM: Most recently 97. Continue q4hr checks.  [redacted]w[redacted]d, MD 3:22 AM

## 2021-03-27 NOTE — Anesthesia Procedure Notes (Signed)
Epidural Patient location during procedure: OB Start time: 03/27/2021 12:46 AM End time: 03/27/2021 12:49 AM  Staffing Anesthesiologist: Kaylyn Layer, MD Performed: anesthesiologist   Preanesthetic Checklist Completed: patient identified, IV checked, risks and benefits discussed, monitors and equipment checked, pre-op evaluation and timeout performed  Epidural Patient position: sitting Prep: DuraPrep and site prepped and draped Patient monitoring: continuous pulse ox, blood pressure and heart rate Approach: midline Location: L3-L4 Injection technique: LOR air  Needle:  Needle type: Tuohy  Needle gauge: 17 G Needle length: 9 cm Needle insertion depth: 6 cm Catheter type: closed end flexible Catheter size: 19 Gauge Catheter at skin depth: 11 cm Test dose: negative and Other (1% lidocaine)  Assessment Events: blood not aspirated, injection not painful, no injection resistance, no paresthesia and negative IV test  Additional Notes Patient identified. Risks, benefits, and alternatives discussed with patient including but not limited to bleeding, infection, nerve damage, paralysis, failed block, incomplete pain control, headache, blood pressure changes, nausea, vomiting, reactions to medication, itching, and postpartum back pain. Confirmed with bedside nurse the patient's most recent platelet count. Confirmed with patient that they are not currently taking any anticoagulation, have any bleeding history, or any family history of bleeding disorders. Patient expressed understanding and wished to proceed. All questions were answered. Sterile technique was used throughout the entire procedure. Please see nursing notes for vital signs.   Crisp LOR on first pass. Test dose was given through epidural catheter and negative prior to continuing to dose epidural or start infusion. Warning signs of high block given to the patient including shortness of breath, tingling/numbness in hands, complete  motor block, or any concerning symptoms with instructions to call for help. Patient was given instructions on fall risk and not to get out of bed. All questions and concerns addressed with instructions to call with any issues or inadequate analgesia.  Reason for block:procedure for pain

## 2021-03-27 NOTE — Progress Notes (Signed)
Pamlea Finder is 25 y.o. G2P0010 at [redacted]w[redacted]d here for IOL for A1GDM  Called to room due to prolonged second stage Patient was complete at ~0830 She labored down for about 1 hour and has been pushing for a total of > 4 hours with an epidural in place.   Currently 10/100/+2  Risks of vacuum assistance were discussed in detail, including but not limited to, bleeding, infection, damage to maternal tissues, fetal cephalohematoma, inability to effect vaginal delivery of the head or shoulder dystocia that cannot be resolved by established maneuvers and need for emergency cesarean section.    Reviewed the options at this point are 1) Continue pushing as she is with the risk of fetal intolerance and emergent CS 2) Vacuum - risks reviewed per above 3) pLTCS - we will review these risks and I discussed that this can be avoided often with vacuum assisted delivery  Federico Flake, MD, MPH, ABFM, [facility] Attending Physician [facility]

## 2021-03-28 LAB — GLUCOSE, CAPILLARY: Glucose-Capillary: 97 mg/dL (ref 70–99)

## 2021-03-28 NOTE — Lactation Note (Signed)
This note was copied from a baby's chart. Lactation Consultation Note  Patient Name: Wendy Kidd [name] Date: 03/28/2021   Age:23 hours   LC Note:  Spoke with RN regarding a first visit with mother.  Per mother, she is not interested in visiting with a lactation consultant until first shift today.     Maternal Data    Feeding    LATCH Score                    Lactation Tools Discussed/Used    Interventions    Discharge    Consult Status      [name] R Micheal Sheen 03/28/2021, 5:58 AM

## 2021-03-28 NOTE — Lactation Note (Signed)
This note was copied from a baby's chart. Lactation Consultation Note  Patient Name: Wendy Kidd JSHFW'Y Date: 03/28/2021 Reason for consult: Initial assessment;1st time breastfeeding;Primapara;Term Age:23 hours  LC in to visit with P1 Mom of term baby.  Mom states baby latched after delivery and fed well, but she hasn't been feeling well enough until this am.  Baby has been supplemented with formula by bottle.   Baby getting her hearing screen done now.  Encouraged Mom to keep baby STS on her chest and offer the breast with any feeding cues.  Mom to call for assistance with positioning and latching.   Reviewed hand expression, Mom reports drops expressed.  Lactation brochure left with Mom.    Interventions Interventions: Breast feeding basics reviewed;Skin to skin;Breast massage;Hand express;LC Services brochure  Consult Status Consult Status: Follow-up Date: 03/29/21 Follow-up type: In-patient    Wendy Kidd 03/28/2021, 11:22 AM

## 2021-03-28 NOTE — Progress Notes (Addendum)
POSTPARTUM PROGRESS NOTE  Subjective: Wendy Kidd is a 25 y.o. G2P1011 s/p vacuum-assisted vaginal delivery at [redacted]w[redacted]d.  She reports she is doing well but she has been unable to urinate since delivery. She is concerned she will break open the stitches and therefore has not tried pushing to urinate. She denies any problems with ambulating or po intake. Denies nausea or vomiting. She has passed flatus. She has not had bowel movement. Pain is well controlled.  Lochia is Minimal.   Objective: BP (!) 107/59   Pulse 61   Temp 97.8 F (36.6 C) (Oral)   Resp 17   Ht 5\' 8"  (1.727 m)   Wt 83.1 kg   LMP 06/19/2020 (Exact Date)   SpO2 98%   Breastfeeding Unknown   BMI 27.84 kg/m   Physical Exam:  General: alert, cooperative and no distress Chest: CTAB, no respiratory distress Abdomen: soft, non-tender Uterine Fundus: firm, appropriately tender Genitalia: swollen labia bilaterally Extremities: No calf swelling, tenderness, or edema  Recent Labs    03/26/21 0930  HGB 11.2*  HCT 34.5*    Assessment/Plan: Wendy Kidd is a 25 y.o. G2P1011 s/p VAVD at [redacted]w[redacted]d for postdates.   LOS: 2 days   PPD#1: Doing well, pain well-controlled.  -- Routine postpartum care, lactation support -- Encouraged up OOB -- Contraception: nexplanon outpatient  -- Feeding: breast feeding and bottle feeding -- Circumcision: N/A -- Urinary retention: tolerated abdominal exam well, denies discomfort. Required overnight in/out cath draining [redacted]w[redacted]d. Bladder U/S during rounds noted . Foley catheter x 24 hrs, continue to apply ice packs to allow swelling to come down.  Dispo: Plan for discharge tomorrow.  , DO 03/28/2021, 7:51 AM PGY-1, [facility]  Attestation of Supervision of Student:  I confirm that I have verified the information documented in the  resident  student's note and that I have also personally reperformed the history, physical exam and all medical decision making  activities.  I have verified that all services and findings are accurately documented in this student's note; and I agree with management and plan as outlined in the documentation. I have also made any necessary editorial changes.  13/02/2021, CNM [facility], [facility] 03/28/2021 10:14 AM

## 2021-03-28 NOTE — Anesthesia Postprocedure Evaluation (Signed)
Anesthesia Post Note  Patient: Wendy Kidd  Procedure(s) Performed: AN AD HOC LABOR EPIDURAL     Patient location during evaluation: Mother Baby Anesthesia Type: Epidural Level of consciousness: awake, oriented and awake and alert Pain management: pain level controlled Vital Signs Assessment: post-procedure vital signs reviewed and stable Respiratory status: spontaneous breathing, respiratory function stable and nonlabored ventilation Cardiovascular status: stable Postop Assessment: no headache, adequate PO intake, able to ambulate, patient able to bend at knees and no apparent nausea or vomiting Anesthetic complications: no   No notable events documented.  Last Vitals:  Vitals:   03/28/21 0142 03/28/21 0527  BP: 113/75 (!) 107/59  Pulse: 88 61  Resp:    Temp:    SpO2:      Last Pain:  Vitals:   03/28/21 0528  TempSrc:   PainSc: 0-No pain   Pain Goal:                   [name]

## 2021-03-29 MED ORDER — IBUPROFEN 600 MG PO TABS: 600.0000 mg | ORAL_TABLET | Freq: Four times a day (QID) | ORAL | 0 refills | Status: AC

## 2021-03-29 MED ORDER — ACETAMINOPHEN 325 MG PO TABS: 650.0000 mg | ORAL_TABLET | ORAL | Status: AC | PRN

## 2021-03-29 NOTE — Discharge Summary (Signed)
Postpartum Discharge Summary       Patient Name: Wendy Kidd DOB: December 20, 1995 MRN: [id number]  Date of admission: 03/26/2021 Delivery date:03/27/2021  Delivering provider: Caren Macadam  Date of discharge: 03/29/2021  Admitting diagnosis: Gestational diabetes mellitus, class A1 [O24.410] Intrauterine pregnancy: [redacted]w[redacted]d     Secondary diagnosis:  Principal Problem:   Vacuum-assisted vaginal delivery Active Problems:   Maternal varicella, non-immune   Supervision of high risk pregnancy, antepartum   GBS (group B streptococcus) UTI complicating pregnancy   Gestational diabetes   Asthma affecting pregnancy in third trimester   Gestational diabetes mellitus, class A1   Prolonged second stage   Obstetric vaginal laceration with first degree perineal laceration  Additional problems: urinary retention postpartum    Discharge diagnosis: Term Pregnancy Delivered                                              Post partum procedures: Augmentation: AROM and Pitocin Complications: prolonged 2nd stage  Hospital course: Induction of Labor With Vaginal Delivery   25 y.o. yo G2P1011 at [redacted]w[redacted]d was admitted to the hospital 03/26/2021 for induction of labor.  Indication for induction: A1 DM.  Patient had an uncomplicated labor course as follows: Membrane Rupture Time/Date: 3:27 AM ,03/27/2021   Delivery Method:Vaginal, Spontaneous  Episiotomy: None  Lacerations:  1st degree  Details of delivery can be found in separate delivery note.  Patient had a routine postpartum course other than urinary retention. Patient is discharged home 03/29/21.  Newborn Data: Birth date:03/27/2021  Birth time:5:11 PM  Gender:Female  Living status:Living  Apgars:8 ,9  Weight:3569 g   Magnesium Sulfate received: No BMZ received: No Rhophylac:N/A MMR:N/A T-DaP:Given prenatally Flu: No Transfusion:No  Physical exam  Vitals:   03/28/21 1010 03/28/21 1500 03/28/21 2209 03/29/21 0557  BP: 109/65 110/71  103/69 113/63  Pulse: 86 96 85 74  Resp: $Remo'16 18 16 16  'SuVrq$ Temp: 98.1 F (36.7 C) 97.8 F (36.6 C) 98.2 F (36.8 C) 97.9 F (36.6 C)  TempSrc: Oral Oral Oral Oral  SpO2: 98%  98%   Weight:      Height:       General: alert, cooperative, and no distress Lochia: appropriate Uterine Fundus: firm Incision: N/A DVT Evaluation: No evidence of DVT seen on physical exam. Negative Homan's sign. No cords or calf tenderness. No significant calf/ankle edema. Labs: Lab Results  Component Value Date   WBC 11.6 (H) 03/26/2021   HGB 11.2 (L) 03/26/2021   HCT 34.5 (L) 03/26/2021   MCV 83.1 03/26/2021   PLT 162 03/26/2021   CMP Latest Ref Rng & Units 02/26/2021  Glucose 70 - 99 mg/dL 107(H)  BUN 6 - 20 mg/dL <5(L)  Creatinine 0.44 - 1.00 mg/dL 0.58  Sodium 135 - 145 mmol/L 134(L)  Potassium 3.5 - 5.1 mmol/L 3.8  Chloride 98 - 111 mmol/L 104  CO2 22 - 32 mmol/L 22  Calcium 8.9 - 10.3 mg/dL 9.2  Total Protein 6.5 - 8.1 g/dL 6.5  Total Bilirubin 0.3 - 1.2 mg/dL 0.6  Alkaline Phos 38 - 126 U/L 153(H)  AST 15 - 41 U/L 16  ALT 0 - 44 U/L 13   Edinburgh Score: Edinburgh Postnatal Depression Scale Screening Tool 03/28/2021  I have been able to laugh and see the funny side of things. 0  I have looked forward with enjoyment  to things. 0  I have blamed myself unnecessarily when things went wrong. 0  I have been anxious or worried for no good reason. 1  I have felt scared or panicky for no good reason. 0  Things have been getting on top of me. 0  I have been so unhappy that I have had difficulty sleeping. 0  I have felt sad or miserable. 0  I have been so unhappy that I have been crying. 0  The thought of harming myself has occurred to me. 0  Edinburgh Postnatal Depression Scale Total 1     After visit meds:  Allergies as of 03/29/2021   No Known Allergies      Medication List     STOP taking these medications    Accu-Chek Guide test strip Generic drug: glucose blood    Accu-Chek Softclix Lancets lancets   calcium carbonate 500 MG chewable tablet Commonly known as: TUMS - dosed in mg elemental calcium       TAKE these medications    acetaminophen 325 MG tablet Commonly known as: Tylenol Take 2 tablets (650 mg total) by mouth every 4 (four) hours as needed (for pain scale < 4).   Advair HFA 45-21 MCG/ACT inhaler Generic drug: fluticasone-salmeterol Inhale 2 puffs into the lungs 2 (two) times daily. What changed:  when to take this reasons to take this   ibuprofen 600 MG tablet Commonly known as: ADVIL Take 1 tablet (600 mg total) by mouth every 6 (six) hours.   montelukast 10 MG tablet Commonly known as: Singulair Take 1 tablet (10 mg total) by mouth at bedtime.   PrePLUS 27-1 MG Tabs Take 1 tablet by mouth daily.         Discharge home in stable condition Infant Feeding: Breast Infant Disposition:home with mother Discharge instruction: per After Visit Summary and Postpartum booklet. Activity: Advance as tolerated. Pelvic rest for 6 weeks.  Diet: routine diet Future Appointments: Future Appointments  Date Time Provider [place]  04/08/2021 10:30 AM Berniece Salines, DO [facility]  05/09/2021  8:15 AM Griffin Basil, MD [facility]  05/09/2021  8:50 AM [facility]   Follow up Visit:  [place] [facility] Follow up on 05/09/2021.   Specialty: Obstetrics and Gynecology Why: for postpartum appointment Contact information: [place] [id number] [phone]                  03/29/2021 Christin Fudge, CNM

## 2021-03-29 NOTE — Progress Notes (Signed)
Pt c/o gas pains in lower stomach and back. Administered Simethicone and applied hot backs to both areas.

## 2021-03-29 NOTE — Lactation Note (Signed)
This note was copied from a baby's chart. Lactation Consultation Note  Patient Name: [name] Date: 03/29/2021 Reason for consult: Follow-up assessment;Primapara;1st time breastfeeding;Term;Infant weight loss;Mother's request;Other (Comment) (per mom the baby last ate at 8 am / 10 ml/ asleep. Mom expressed she would like to work on latching/ LC encouraged mom to call when baby showing feeding cues.) Age:23 hours LC reviewed the doc flow sheets/ mostly bottles .  LC discussed with mom and dad ways to re-latch the baby.   Maternal Data    Feeding Mother's Current Feeding Choice: Breast Milk and Formula Nipple Type: Slow - flow  LATCH Score                    Lactation Tools Discussed/Used    Interventions Interventions: Breast feeding basics reviewed;Education  Discharge    Consult Status Consult Status: Follow-up Date: 03/29/21 Follow-up type: In-patient    [name] 03/29/2021, 9:29 AM

## 2021-03-29 NOTE — Lactation Note (Signed)
This note was copied from a baby's chart. Lactation Consultation Note  Patient Name: Girl Lear Carstens [id number] Date: 03/29/2021 Reason for consult: 1st time breastfeeding;Primapara;Term;Follow-up assessment;Infant weight loss;Breastfeeding assistance;Other (Comment) (2nd LC visit / baby has had many) Age:23 hours/  As LC entered the room / MBURN assisting mom and baby very fussy/ getting ready to latch. Dr. Guadelupe Sabin entered to do D/C exam. Wet diaper changed and LC assisted mom to latch for 8 mins with swallows/ and per mom comfortable.  Baby was given 8 ml of supplement prior to attempt to latch.  LC plan:  LC stressed the importance when taking baby to the breast / calm / and the appetizer probably will help until the milk comes in.  Recommended steps to transition baby to the breast  Breast massage, hand express, pre-pump with the hand pump to prime the milk ducts , appetizer of EBM or formula if needed and latch with firm support ( as shown) .  Shells provided to stretch the nipple / areola complex.  Supplement after the latch if the baby is not satisfied until the mil comes in well.  After feeding the baby post pump both breast 10 - 15 mins / save milk for the next feeding.  Engorgement prevention and tx reviewed - referring to the [facility] care booklet.  Maternal Data    Feeding Mother's Current Feeding Choice: Breast Milk and Formula Nipple Type: Slow - flow  LATCH Score      Lactation Tools Discussed/Used    Interventions Interventions: Breast feeding basics reviewed;Assisted with latch;Skin to skin;Breast massage;Hand express;Pre-pump if needed;Reverse pressure;Breast compression;Adjust position;Support pillows;Position options;Shells;Hand pump;Education;LC Services brochure  Discharge Discharge Education: Engorgement and breast care;Outpatient recommendation;Other (comment) (if the baby is not transitioming to the breast well.) Pump: Personal;Manual;DEBP  Consult Status Consult  Status: Complete Date: 03/29/21 Follow-up type: In-patient    Matilde Sprang Radhika Dershem 03/29/2021, 11:05 AM

## 2021-04-01 ENCOUNTER — Other Ambulatory Visit: Payer: Self-pay

## 2021-04-01 ENCOUNTER — Ambulatory Visit (INDEPENDENT_AMBULATORY_CARE_PROVIDER_SITE_OTHER): Payer: No Typology Code available for payment source | Admitting: Family Medicine

## 2021-04-01 NOTE — Progress Notes (Signed)
   MOM+BABY Combined Care  Subjective:   Wendy Kidd is a 25 y.o. G30P1011 female here for a problem GYN visit.  Current complaints: none- feeling well after delivery.     S/p VAVD with prolonged 2nd stage. 1st degree laceration. Currently breast and bottlefeeding. Reports infant latched initially (I witness in LD) and then mom was tired and pumped to provide BM and also provided some formula. Wendy Kidd started latching over the weekend. Denies nipple pain. Some minor soreness. Wendy Kidd is leaking milk  Reports vaginal pain is improving Continues to have lochia   Denies abnormal vaginal bleeding, discharge, pelvic pain, problems with intercourse or other gynecologic concerns.    Gynecologic History Patient's last menstrual period was 06/19/2020 (exact date).  Contraception: none  Health Maintenance Due  Topic Date Due   Pneumococcal Vaccine 20-31 Years old (1 - PCV) Never done   HPV VACCINES (1 - 2-dose series) Never done   Hepatitis C Screening  Never done    The following portions of the patient's history were reviewed and updated as appropriate: allergies, current medications, past family history, past medical history, past social history, past surgical history and problem list.  Review of Systems Pertinent items are noted in HPI.   Objective:  LMP 06/19/2020 (Exact Date)  Gen: well appearing, NAD HEENT: no scleral icterus CV: RR Lung: Normal WOB Ext: warm well perfused   Assessment and Plan:   1. Prolonged second stage Doing well   2. Lactation due to pregnancy Encouraged latching Wendy Kidd Reviewed latch positions and frequency of feeding  3. Vacuum-assisted vaginal delivery Healing well. No acute complaints.    Please refer to After Visit Summary for other counseling recommendations.   Return in about 4 weeks (around 04/29/2021) for Postpartum visit, Dual Care-[facility] [place].  Wendy Flake, MD, MPH, ABFM Attending Physician Faculty Practice- Center for [facility]

## 2021-04-01 NOTE — Progress Notes (Deleted)
    Post Partum Visit Note  Wendy Kidd is a 25 y.o. G57P1011 female who presents for a postpartum visit. She is 5 days postpartum following a normal spontaneous vaginal delivery.  I have fully reviewed the prenatal and intrapartum course. The delivery was at *** gestational weeks.  Anesthesia: epidural. Postpartum course has been ***. Baby is doing well***. Baby is feeding by both breast and bottle - Similac Advance. Bleeding staining only. Bowel function is normal. Bladder function is normal. Patient is not sexually active. Contraception method is none. Postpartum depression screening: negative.   The pregnancy intention screening data noted above was reviewed. Potential methods of contraception were discussed. The patient elected to proceed with No data recorded.    Health Maintenance Due  Topic Date Due   Pneumococcal Vaccine 97-27 Years old (1 - PCV) Never done   HPV VACCINES (1 - 2-dose series) Never done   Hepatitis C Screening  Never done    {Common ambulatory SmartLinks:19316}  Review of Systems {ros; complete:30496}  Objective:  LMP 06/19/2020 (Exact Date)    General:  {gen appearance:16600}   Breasts:  {desc; normal/abnormal/not indicated:14647}  Lungs: {lung exam:16931}  Heart:  {heart exam:5510}  Abdomen: {abdomen exam:16834}   Wound {Wound assessment:11097}  GU exam:  {desc; normal/abnormal/not indicated:14647}       Assessment:    There are no diagnoses linked to this encounter.  *** postpartum exam.   Plan:   Essential components of care per ACOG recommendations:  1.  Mood and well being: Patient with {gen negative/positive:315881} depression screening today. Reviewed local resources for support.  - Patient tobacco use? {tobacco use:25506}  - hx of drug use? {yes/no:25505}    2. Infant care and feeding:  -Patient currently breastmilk feeding? {yes/no:25502}  -Social determinants of health (SDOH) reviewed in EPIC. No concerns***The following needs were  identified***  3. Sexuality, contraception and birth spacing - Patient {DOES_DOES ZOX:09604} want a pregnancy in the next year.  Desired family size is {NUMBER 1-10:22536} children.  - Reviewed forms of contraception in tiered fashion. Patient desired {PLAN CONTRACEPTION:313102} today.   - Discussed birth spacing of 18 months  4. Sleep and fatigue -Encouraged family/partner/community support of 4 hrs of uninterrupted sleep to help with mood and fatigue  5. Physical Recovery  - Discussed patients delivery and complications. She describes her labor as {description:25511} - Patient had a {CHL AMB DELIVERY:9294331684}. Patient had a {laceration:25518} laceration. Perineal healing reviewed. Patient expressed understanding - Patient has urinary incontinence? {yes/no:25515} - Patient {ACTION; IS/IS VWU:98119147} safe to resume physical and sexual activity  6.  Health Maintenance - HM due items addressed {Yes or If no, why not?:20788} - Last pap smear No results found for: DIAGPAP Pap smear {done:10129} at today's visit.  -Breast Cancer screening indicated? {indicated:25516}  7. Chronic Disease/Pregnancy Condition follow up: {Follow up:25499}  - PCP follow up  Aviva Signs, CMA Center for [facility], [facility]

## 2021-04-08 ENCOUNTER — Ambulatory Visit: Payer: No Typology Code available for payment source | Admitting: Cardiology

## 2021-04-09 ENCOUNTER — Encounter: Payer: Self-pay | Admitting: Family Medicine

## 2021-04-09 ENCOUNTER — Telehealth (HOSPITAL_COMMUNITY): Payer: Self-pay | Admitting: *Deleted

## 2021-04-09 NOTE — Telephone Encounter (Signed)
Phone voicemail message left to return nurse call.  Duffy Rhody, RN 04-09-2021 at 11:45am

## 2021-04-12 ENCOUNTER — Other Ambulatory Visit: Payer: Self-pay

## 2021-04-12 ENCOUNTER — Encounter (HOSPITAL_COMMUNITY): Payer: Self-pay | Admitting: *Deleted

## 2021-04-12 ENCOUNTER — Inpatient Hospital Stay (HOSPITAL_COMMUNITY)
Admission: AD | Admit: 2021-04-12 | Discharge: 2021-04-12 | Disposition: A | Discharge status: Home / Self Care | Payer: No Typology Code available for payment source | Attending: Obstetrics and Gynecology | Admitting: Obstetrics and Gynecology

## 2021-04-12 DIAGNOSIS — N3000 Acute cystitis without hematuria: Secondary | ICD-10-CM | POA: Diagnosis not present

## 2021-04-12 DIAGNOSIS — Z20822 Contact with and (suspected) exposure to covid-19: Secondary | ICD-10-CM | POA: Insufficient documentation

## 2021-04-12 DIAGNOSIS — O8689 Other specified puerperal infections: Secondary | ICD-10-CM | POA: Diagnosis present

## 2021-04-12 DIAGNOSIS — J111 Influenza due to unidentified influenza virus with other respiratory manifestations: Secondary | ICD-10-CM | POA: Diagnosis present

## 2021-04-12 DIAGNOSIS — R5081 Fever presenting with conditions classified elsewhere: Secondary | ICD-10-CM | POA: Diagnosis not present

## 2021-04-12 DIAGNOSIS — B9789 Other viral agents as the cause of diseases classified elsewhere: Secondary | ICD-10-CM | POA: Diagnosis not present

## 2021-04-12 LAB — URINALYSIS, ROUTINE W REFLEX MICROSCOPIC
Bilirubin Urine: NEGATIVE
Glucose, UA: NEGATIVE mg/dL
Hgb urine dipstick: NEGATIVE
Ketones, ur: NEGATIVE mg/dL
Nitrite: NEGATIVE
Protein, ur: 30 mg/dL — AB
Specific Gravity, Urine: 1.016 (ref 1.005–1.030)
WBC, UA: >50 WBC/hpf — ABNORMAL HIGH (ref 0–5)
pH: 5 (ref 5.0–8.0)

## 2021-04-12 LAB — RESP PANEL BY RT-PCR (FLU A&B, COVID) ARPGX2
Influenza A by PCR: NEGATIVE
Influenza B by PCR: NEGATIVE
SARS Coronavirus 2 by RT PCR: NEGATIVE

## 2021-04-12 MED ORDER — ACETAMINOPHEN 500 MG PO TABS
1000.0000 mg | ORAL_TABLET | Freq: Once | ORAL | Status: AC
  Administered 2021-04-12: 1000 mg via ORAL
  Filled 2021-04-12: qty 2

## 2021-04-12 MED ORDER — CEFADROXIL 500 MG PO CAPS: 500.0000 mg | ORAL_CAPSULE | Freq: Once | ORAL | 0 refills | Status: AC

## 2021-04-12 MED ORDER — CEFADROXIL 500 MG PO CAPS
500.0000 mg | ORAL_CAPSULE | Freq: Once | ORAL | Status: AC
  Administered 2021-04-12: 500 mg via ORAL
  Filled 2021-04-12: qty 1

## 2021-04-12 MED ORDER — IBUPROFEN 600 MG PO TABS
600.0000 mg | ORAL_TABLET | Freq: Once | ORAL | Status: AC
  Administered 2021-04-12: 600 mg via ORAL
  Filled 2021-04-12: qty 1

## 2021-04-12 NOTE — ED Provider Notes (Signed)
Emergency Medicine Provider Triage Evaluation Note  Wendy Kidd , a 25 y.o. female  was evaluated in triage.  Pt is 2 weeks postpartum, 6 days status postdiagnosis with influenza having completed Tamiflu yesterday.  She continues to have severe episodes of rigors, T-max at home 104 F, and rapid heart rate with intermittent episodes of shortness of breath.  She feels that she is pale.  No abdominal pain but nausea, and vomiting..  Review of Systems  Positive: Nausea, vomiting, body aches, fevers, rigors Negative: Chest pain, palpitations  Physical Exam  BP 120/69 (BP Location: Right Arm)   Pulse (!) 133   Temp (!) 103.2 F (39.6 C)   Resp (!) 22   Ht 5\' 8"  (1.727 m)   Wt 75.7 kg   LMP 06/19/2020 (Exact Date)   SpO2 100%   BMI 25.38 kg/m  Gen:   Awake, no distress   Resp:  Normal effort  MSK:   Moves extremities without difficulty  Other:  Tachycardic with regular rhythm.  Abdomen soft, nondistended, nontender.  Medical Decision Making  Medically screening exam initiated at 7:16 PM.  Appropriate orders placed.  Wendy Kidd was informed that the remainder of the evaluation will be completed by another provider, this initial triage assessment does not replace that evaluation, and the importance of remaining in the ED until their evaluation is complete.  Case discussed with MAU APP Matthias Hughs, who is agreeable to seeing this patient in their department.  I appreciate her collaboration in the care of this patient.  This chart was dictated using voice recognition software, Dragon. Despite the best efforts of this provider to proofread and correct errors, errors may still occur which can change documentation meaning.    Rayfield Citizen 04/12/21 1917    04/14/21, MD 04/12/21 2028

## 2021-04-12 NOTE — Discharge Instructions (Addendum)
You came into the hospital because you had a high fever due to the flu. You got Tylenol in the emergency room which helped with the fever coming down. We also gave you some ibuprofen.  We examined you and did not see any signs of a breast infection. Your bleeding was normal and minimal so low likelihood that you have a uterine infection.  We did check your urine and it showed some signs that you might have a urinary tract infection so we started an antibiotic that is safe with breastfeeding.  Please take Ibuprofen 600mg  every 6 hours and Tylenol 1000mg  every 6 hours. You can alternate the doses.   Please seek medical care if you have fevers through around the clock tylenol and ibuprofen, start noticing redness of one part of your breast, or start having breast pain even when not full. Please also seek medical care if you chart having nausea and vomiting that wont get better or you are not able to keep food or drinks down.

## 2021-04-12 NOTE — ED Notes (Signed)
The pt is going to [facility]  report given  transport called

## 2021-04-12 NOTE — ED Triage Notes (Signed)
The pt has been ill since Sunday with fever chills and she was positive for flu  she delivered 2 weeks ago.  She has not had tylenol since 0600am today  she reports that she is no better

## 2021-04-12 NOTE — MAU Provider Note (Signed)
History     CSN: [id number]  Arrival date and time: 04/12/21 1806   Event Date/Time   First Provider Initiated Contact with Patient 04/12/21 2032      Chief Complaint  Patient presents with   Fever   HPI PPD#17 G2P1011 with known diagnosis of influenza 5 days ago who presented to ED with fever of 103 and chills  after not taking any analgesics since 5 AM. Now afebrile after Tylenol in ED  #Fever - She reports she feels better after tylenol and usually fever defervesces once she takes analgesics - today she wanted to see if she could space out analgesics and so had not taken any since 5-6AM - denies nausea or vomiting\ - reports went to urgent care 5 days ago when she had flu like sx and was diagnosed with influenza and treated with Tamiflu - she completed her course of Tamiflu - she reports when she does take Ibuprofen or Tylenol more regularly she does not have a fever. - of note patient had prolonged 2nd stage of labor (~6 hrs of pushing) and had urinary retention after and had a foley catheter for 24 hrs - since removal of catheter she has not had any urinary sx - denies dysuria, hematuria, and pyuria - reports when she has a fever she gets a cough and chills - hydrating well with pedialyte and water - decrease in appetite while having the flu but able to keep food down - denies breast pain or redness - is breastfeeding, at this time not feeling engorged - vaginal bleeding is minimal, changing pads once a day  Past Medical History:  Diagnosis Date   Asthma    History of ectopic pregnancy 08/08/2020   Medical history non-contributory    Syncopal episodes    Tachycardia    paroxysmal supraventricular tachycardia - sees cardiologist, diagnosed in pregnancy    Past Surgical History:  Procedure Laterality Date   DIAGNOSTIC LAPAROSCOPY WITH REMOVAL OF ECTOPIC PREGNANCY Right 04/15/2020   Procedure: DIAGNOSTIC LAPAROSCOPY WITH REMOVAL OF ECTOPIC PREGNANCY;  Surgeon:  Donnamae Jude, MD;  Location: [facility];  Service: Gynecology;  Laterality: Right;    Family History  Problem Relation Age of Onset   Asthma Mother     Social History   Tobacco Use   Smoking status: Never   Smokeless tobacco: Never  Vaping Use   Vaping Use: Never used  Substance Use Topics   Alcohol use: Never   Drug use: Never    Allergies: No Known Allergies  Medications Prior to Admission  Medication Sig Dispense Refill Last Dose   acetaminophen (TYLENOL) 325 MG tablet Take 2 tablets (650 mg total) by mouth every 4 (four) hours as needed (for pain scale < 4).      fluticasone-salmeterol (ADVAIR HFA) 45-21 MCG/ACT inhaler Inhale 2 puffs into the lungs 2 (two) times daily. (Patient taking differently: Inhale 2 puffs into the lungs 2 (two) times daily as needed (sob).) 1 each 12    ibuprofen (ADVIL) 600 MG tablet Take 1 tablet (600 mg total) by mouth every 6 (six) hours. 30 tablet 0    montelukast (SINGULAIR) 10 MG tablet Take 1 tablet (10 mg total) by mouth at bedtime. (Patient not taking: Reported on 04/01/2021) 30 tablet 1    Prenatal Vit-Fe Fumarate-FA (PREPLUS) 27-1 MG TABS Take 1 tablet by mouth daily.       Review of Systems  Constitutional:  Negative for chills, diaphoresis, fatigue and fever.  No fever at time of MAU assessment  Respiratory:  Negative for chest tightness and shortness of breath.   Cardiovascular:  Negative for chest pain.  Gastrointestinal:  Negative for abdominal pain.  Endocrine: Negative for polyuria.  Genitourinary:  Negative for dysuria, hematuria and urgency.  Musculoskeletal:  Negative for back pain.  Skin:  Negative for rash.  Neurological:  Negative for headaches.  Physical Exam   Blood pressure 107/65, pulse 77, temperature 99.1 F (37.3 C), temperature source Oral, resp. rate 16, height 5\' 8"  (1.727 m), weight 75.7 kg, last menstrual period 06/19/2020, SpO2 99 %, currently breastfeeding.  Physical Exam Vitals and nursing note  reviewed.  Constitutional:      Appearance: Normal appearance.     Comments: Well appearing, sitting comfortably in bed.  HENT:     Head: Normocephalic.     Nose: Nose normal.  Eyes:     Conjunctiva/sclera: Conjunctivae normal.     Pupils: Pupils are equal, round, and reactive to light.  Cardiovascular:     Rate and Rhythm: Normal rate and regular rhythm.     Pulses: Normal pulses.  Pulmonary:     Effort: Pulmonary effort is normal.  Abdominal:     General: Abdomen is flat. There is no distension.     Tenderness: There is no abdominal tenderness. There is no right CVA tenderness, left CVA tenderness or guarding.  Musculoskeletal:        General: Normal range of motion.  Skin:    General: Skin is warm.     Capillary Refill: Capillary refill takes less than 2 seconds.     Comments: Examined bilateral breasts. No areas of redness or skin changes.   Neurological:     General: No focal deficit present.     Mental Status: She is alert.  Psychiatric:        Mood and Affect: Mood normal.    MAU Course    MDM Moderate  Assessment and Plan  PPD#17 G2P1011 with known diagnosis of influenza 5 days ago who presented to ED with fever of 103 today after not taking any analgesics since 5 AM. She reports she had fevers prior to diagnosis of influenza and fevers improved when she was on analgesics. Temp now afebrile. DDX: continued symptoms if influenza vs. other sources of infection. No urinary sx but UA with bacteria and leuks and no nitrates therefore possibly with cystitis (has risk factors given hx of foley placement in post partum period). Breast infection unlikely given normal breast exam and no symptoms. Endometritis unlikely given no abdominal discomfort.  - UA with bacteria and leuks, no urinary sx, start Duricef and send 5 day course - Ibuprofen now - Given counseling on ibuprofen q6hr and tylenol 1000mg  q6-8r - urine cx sent  Likely febrile in setting of her influenza infection  and without analgesics. Also with likely UTI based on UA although no urinary symptoms at this time including no CVA tenderness - given one dose of Duricef here and remaining of 5 day course sent to pharmacy - discussed findings with patient and discussed return precautions in detail including symptoms of worsening urinary tract infection/pyelo   08/17/2020, MD, MPH OB Fellow, Faculty Practice

## 2021-04-12 NOTE — MAU Note (Signed)
Pt reports she was running a fever on Sunday of 102 degrees and she was tested for the flu and it was positive. Pt reports she took has been taking all of her medications including tylenol, ibuprofen, and tamiflu. Pt reports she is still having high fevers that go up to 104 degrees. Pt reports she is still having chills. Pt reports she had a baby 2 weeks ago and wants to make sure she does not have an infection. Pt reports looking pale.

## 2021-04-13 ENCOUNTER — Other Ambulatory Visit: Payer: Self-pay | Admitting: Advanced Practice Midwife

## 2021-04-13 DIAGNOSIS — O26899 Other specified pregnancy related conditions, unspecified trimester: Secondary | ICD-10-CM

## 2021-04-13 MED ORDER — CEFADROXIL 500 MG PO CAPS: 500.0000 mg | ORAL_CAPSULE | Freq: Two times a day (BID) | ORAL | 0 refills | Status: AC

## 2021-04-13 NOTE — Progress Notes (Signed)
Patient called [facility] to report pharmacy had been unable to fill prescription as previously written. Rx corrected, patient called back at 10:20 am to confirm rx resent and confirm pharmacy. Urine culture in work.  Clayton Bibles, MSA, MSN, CNM Certified Nurse Midwife, Biochemist, clinical for [employer], [facility]

## 2021-04-15 LAB — URINE CULTURE: Culture: >=100000 — AB

## 2021-04-17 ENCOUNTER — Ambulatory Visit: Payer: No Typology Code available for payment source | Admitting: Cardiology

## 2021-05-02 ENCOUNTER — Ambulatory Visit: Payer: No Typology Code available for payment source | Admitting: Cardiology

## 2021-05-08 ENCOUNTER — Ambulatory Visit: Payer: No Typology Code available for payment source | Admitting: Nurse Practitioner

## 2021-05-09 ENCOUNTER — Ambulatory Visit: Payer: No Typology Code available for payment source | Admitting: Obstetrics and Gynecology

## 2021-05-09 ENCOUNTER — Other Ambulatory Visit: Payer: No Typology Code available for payment source

## 2022-04-06 IMAGING — US US MFM OB DETAIL+14 WK
1 series · 12 of 28 positions shown · non-contrast
Comparison: none

[Series 1: us mfm ob detail+14 wk · 88 acquisitions, 12 frames shown]
[im 4/88]
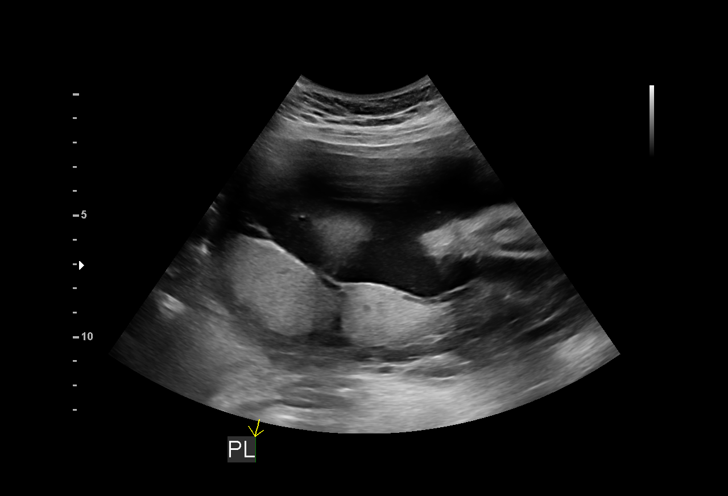
[im 10/88]
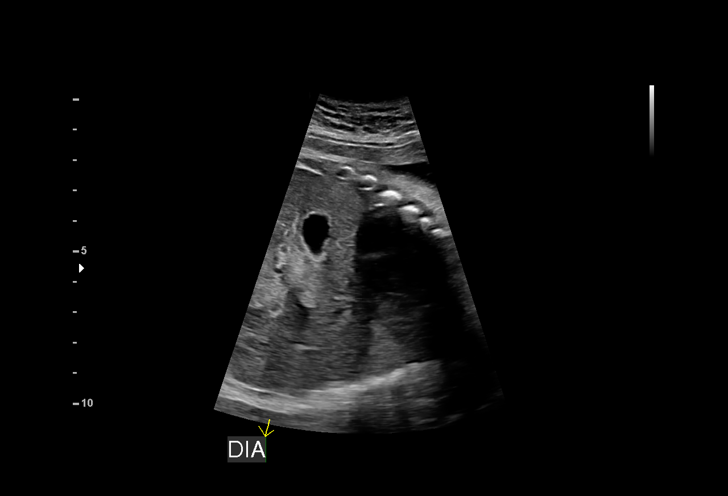
[im 17/88]
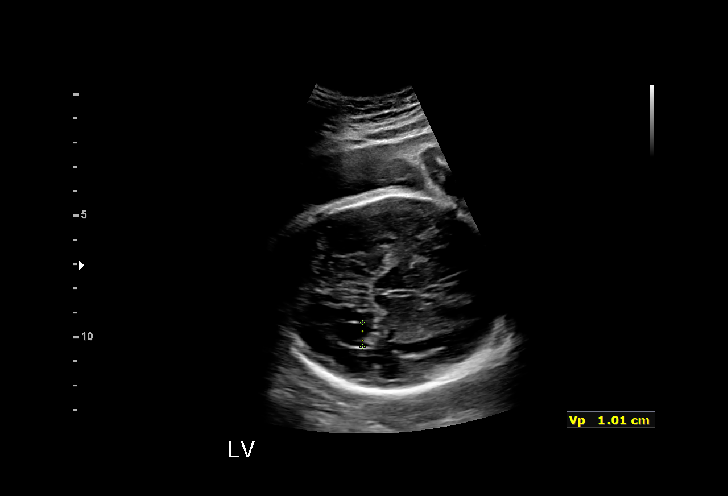
[im 26/88]
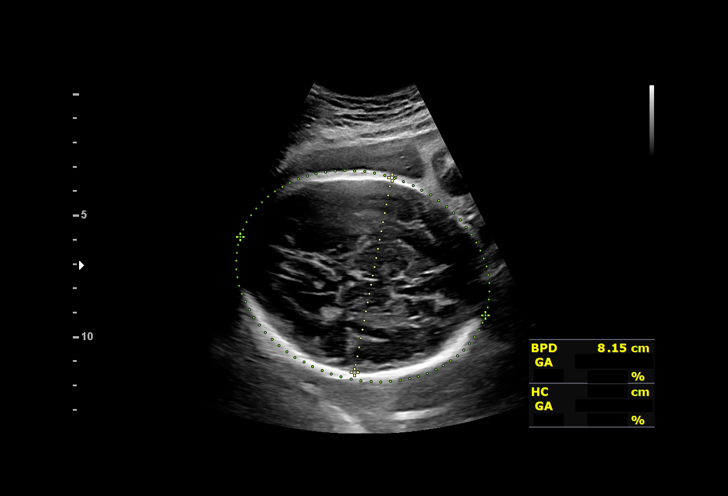
[im 33/88]
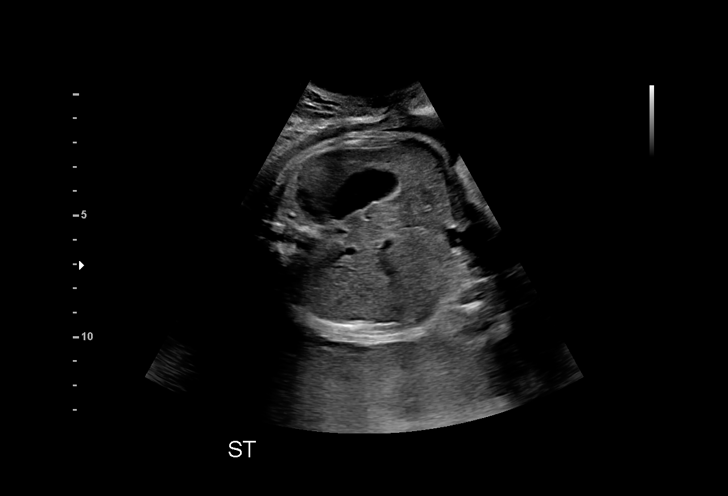
[im 39/88]
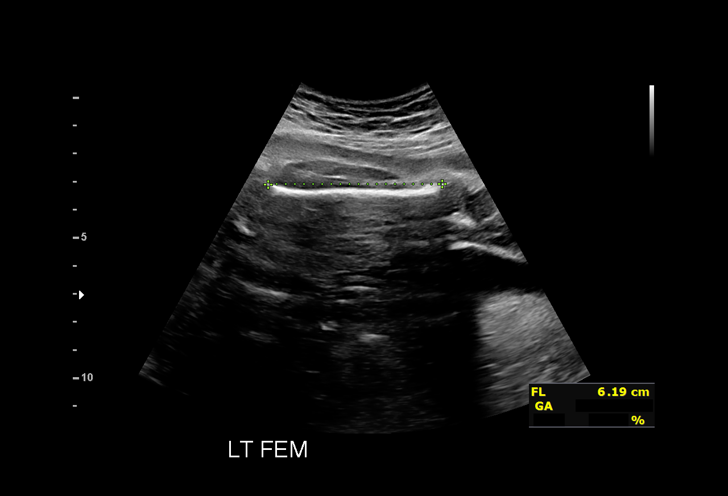
[im 49/88]
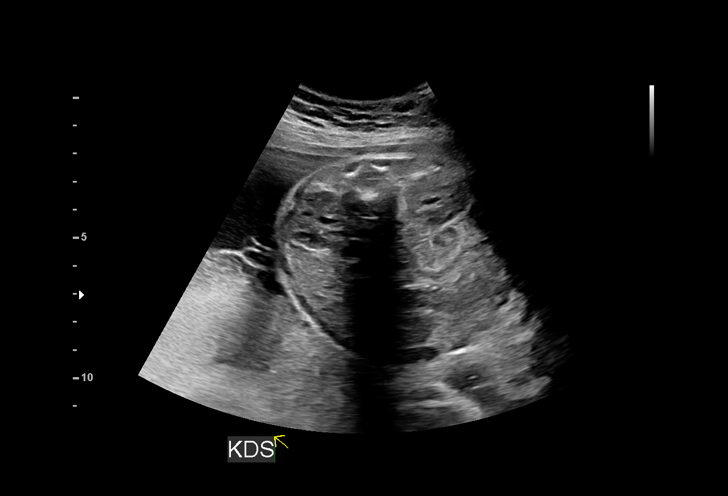
[im 55/88]
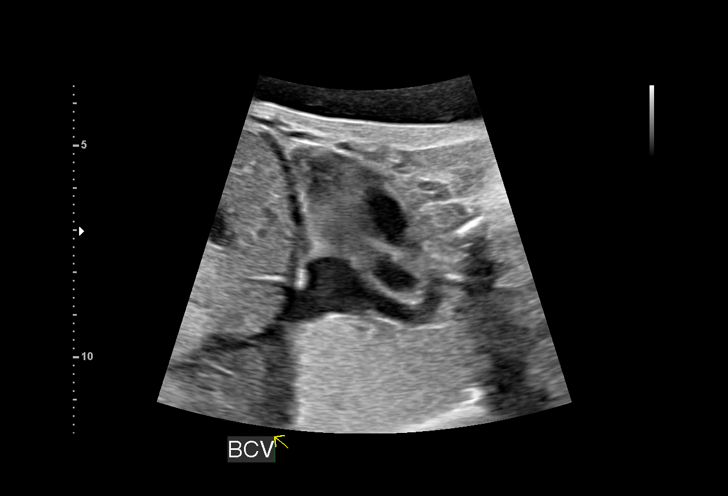
[im 62/88]
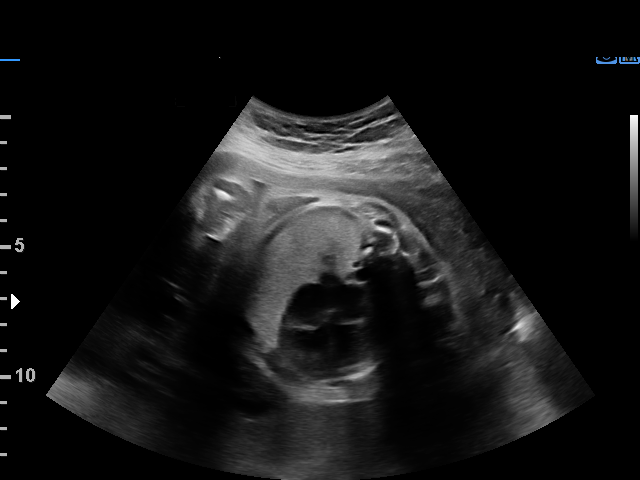
[im 71/88]
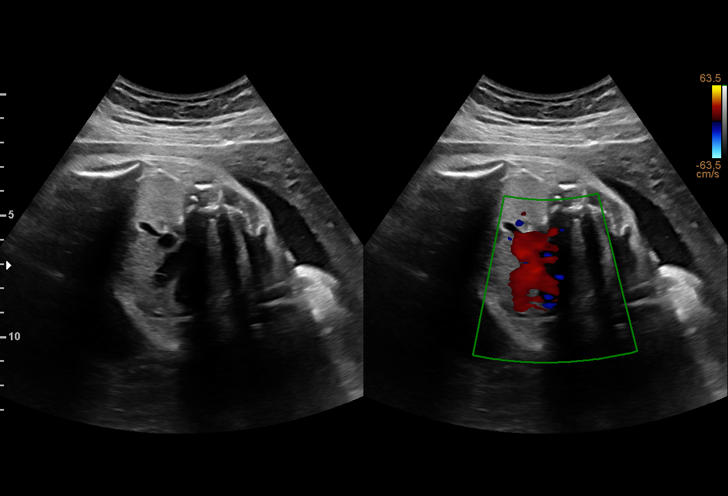
[im 78/88]
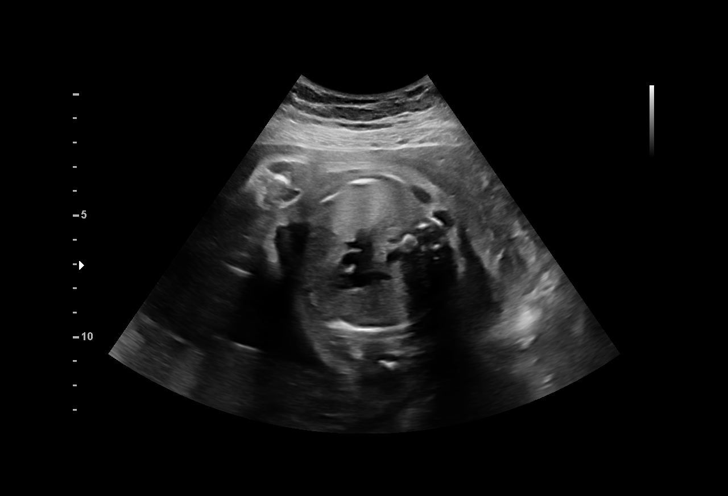
[im 84/88]
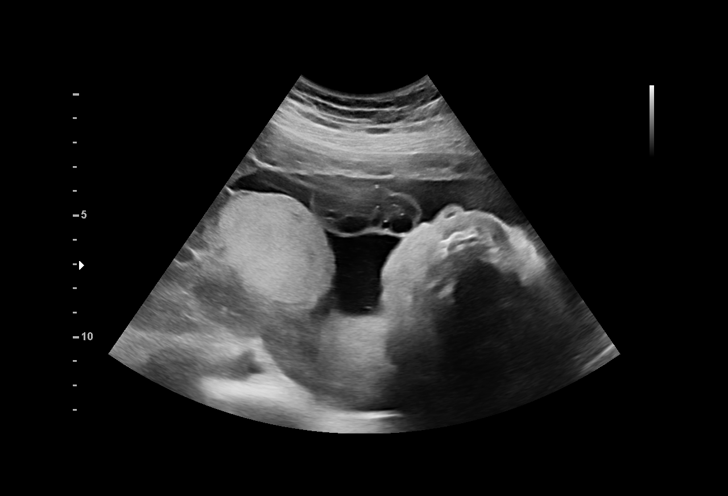

[12 of 28 positions shown; findings below may reference images not displayed]

Indications

 30 weeks gestation of pregnancy
 Late to prenatal care, third trimester
 History of ectopic
 Encounter for antenatal screening for
 malformations
Fetal Evaluation

 Num Of Fetuses:         1
 Preg. Location:         Intrauterine
 Fetal Heart Rate(bpm):  152
 Cardiac Activity:       Observed
 Presentation:           Cephalic
 Placenta:               Posterior
 P. Cord Insertion:      Visualized

 Amniotic Fluid
 AFI FV:      Within normal limits

 AFI Sum(cm)     %Tile       Largest Pocket(cm)
 17.45           65

 RUQ(cm)       RLQ(cm)       LUQ(cm)        LLQ(cm)

Biometry

 BPD:      80.7  mm     G. Age:  32w 3d         94  %    CI:        71.74   %    70 - 86
                                                         FL/HC:      20.0   %    19.2 -
 HC:      303.3  mm     G. Age:  33w 5d         96  %    HC/AC:      1.07        0.99 -
 AC:      283.7  mm     G. Age:  32w 3d         95  %    FL/BPD:     75.1   %    71 - 87
 FL:       60.6  mm     G. Age:  31w 4d         73  %    FL/AC:      21.4   %    20 - 24
 HUM:      52.8  mm     G. Age:  30w 5d         64  %
 LV:       10.7  mm

 Est. FW:    9109  gm      4 lb 4 oz     96  %
OB History

 Blood Type:   A+
 Gravidity:    2
 Ectopic:      1
Gestational Age

 LMP:           30w 1d        Date:  06/19/20                 EDD:   03/26/21
 U/S Today:     32w 4d                                        EDD:   03/09/21
 Best:          30w 1d     Det. By:  LMP  (06/19/20)          EDD:   03/26/21
Anatomy

 Cranium:               Appears normal         LVOT:                   Appears normal
 Cavum:                 Appears normal         Aortic Arch:            Appears normal
 Ventricles:            Ventriculomegaly,      Ductal Arch:            Not well visualized
                        11.2 mm
 Choroid Plexus:        Appears normal         Diaphragm:              Appears normal
 Cerebellum:            Not well visualized    Stomach:                Appears normal, left
                                                                       sided
 Posterior Fossa:       Appears normal         Abdomen:                Appears normal
 Nuchal Fold:           Not applicable (>20    Abdominal Wall:         Appears nml (cord
                        wks GA)                                        insert, abd wall)
 Face:                  Appears normal         Cord Vessels:           Appears normal (3
                        (orbits and profile)                           vessel cord)
 Lips:                  Appears normal         Kidneys:                Appear normal
 Palate:                Appears normal         Bladder:                Appears normal
 Thoracic:              Appears normal         Spine:                  Appears normal
 Heart:                 Appears normal         Upper Extremities:      Visualized
                        (4CH, axis, and
                        situs)
 RVOT:                  Previously seen        Lower Extremities:      Visualized

 Other:  3VTV, BCV visualized. Technically difficult due to advanced GA and
         fetal position.
Cervix Uterus Adnexa

 Cervix
 Not visualized (advanced GA >82wks)

 Uterus
 No abnormality visualized.

 Right Ovary
 Not visualized.

 Left Ovary
 Not visualized.

 Cul De Sac
 No free fluid seen.
 Adnexa
 No abnormality visualized.
Impression

 Ms. Rodopski is a 25 yo G2P0 who is here for a detailed
 anatomy due to late transfer of care.

 Ms. Rodopski denies medical complications with exception of a
 ruptured ectopic. She otherwised denies substance abuse or
 family history of genetic defects. She denies chronic disease.

 She reports having a low risk NIPS result. This was not
 available at today's exam.

 We observed a single intrauterine pregnancy.
 Normal anatomy with measurements consistent with dates
 There is good fetal movement and amniotic fluid volume
 Suboptimal views of the fetal anatomy were obtained
 secondary to fetal position.

 Isolated unilateral ventriculomegaly was observed with
 measurement of 1.12 mm of the right ventricle.
 Ventriculomegaly (ISHAK) is defined as the diameter of one or
 both lateral ventricles measuring 10 mm or larger. ISHAK is
 further divided according to the severity of the dilation; in
 borderline or mild ISHAK, the lateral ventricles measure 10-12
 mm; in moderate ISHAK, 12.1-14.9 mm, and in severe ISHAK, >15
 mm. Ventriculomegaly is not a diagnosis; it is a sign caused
 by a variety of pathologies. The overall incidence of ISHAK is
 [DATE] to [DATE] births. However, the reported incidence of
 borderline or mild Med Said Nya from 1.4/2666 births in the low-
 risk population to 22/2666 births in the high-risk population. In
 a study of mild to moderate ISHAK between 18 and 24 weeks,
 the incidence was 7.8 per 10,000 births. The pathogenesis of
 isolated mild ISHAK has not been clearly elucidated and often
 remains unknown. It most likely is multifactorial and
 frequently associated with other brain anomalies,
 chromosomal aneuploidy, or fetal infections. In borderline
 ventriculomegaly, the lateral ventricles measure between 10-
 12mm. Resolution of mild isolated ISHAK occurs in as many as
 62% of the cases before 24 weeks' gestation. Males are more
 commonly diagnosed with mild ISHAK than females.

 I discussed with Ms. Petrova Ro results and reviewed the
 imaging in particular. We discussed the indication for
 diagnostic testing, she declined given that this would not
 change the management of the pregnancy and that there was
 a [DATE] risk for perinatal loss. I addition we reviewed
 that overall that this is likely a normal variant given that this is
 an isolated finding.  In a review, the pooled prevalence of
 neurodevelopmental delay in isolated mild ISHAK with normal
 karyotype was 4.9% (relative risk RR = 3.507; 95%
 confidence interval CI, 1.718-7.155; P < .001). 10.7% of
 females had neurodevelopment delay, compared with 5.6%
 of males; however, this difference was not statistically
 significant.

 Lastly, I offered TORCH titers however she had already given
 blood today and had a short syncopal episode while being
 the examined. She will return in 3-4 weeks for a follow up
 growth and obtain TORCH titers between that next visit.

 A vaginal delivery is not contraindicated in cases of mild ISHAK.
 Cesarean delivery for routine obstetric indications. However,
 each case needs to be individualized, especially those with
 associated malformations.

 I spent 30 minutes with > 50% in face to face consultation.

 All questions answered

## 2022-05-04 IMAGING — US US MFM OB FOLLOW-UP
1 series · 14 of 28 positions shown · non-contrast
Comparison: none

[Series 1: us mfm ob follow-up · 32 acquisitions, 14 frames shown]
[im 2/32]
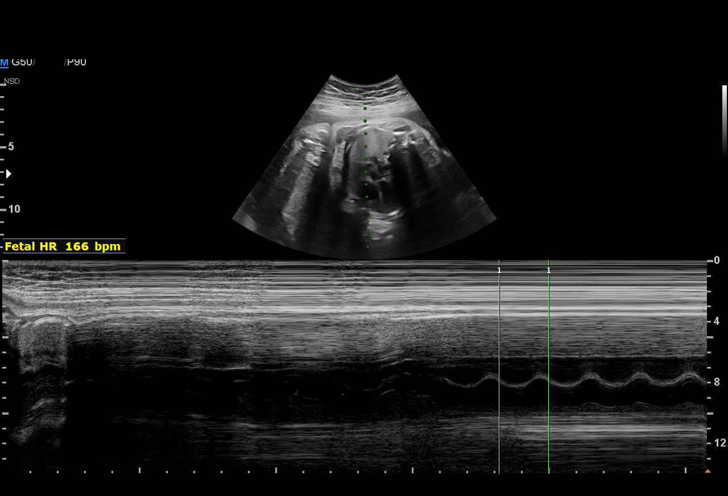
[im 4/32]
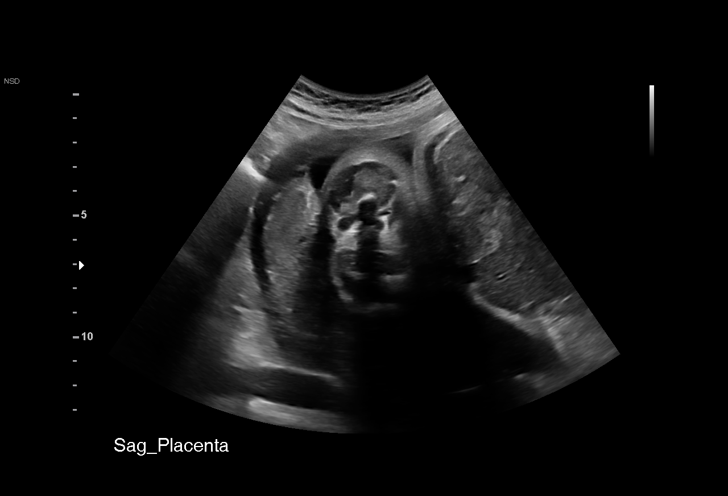
[im 6/32]
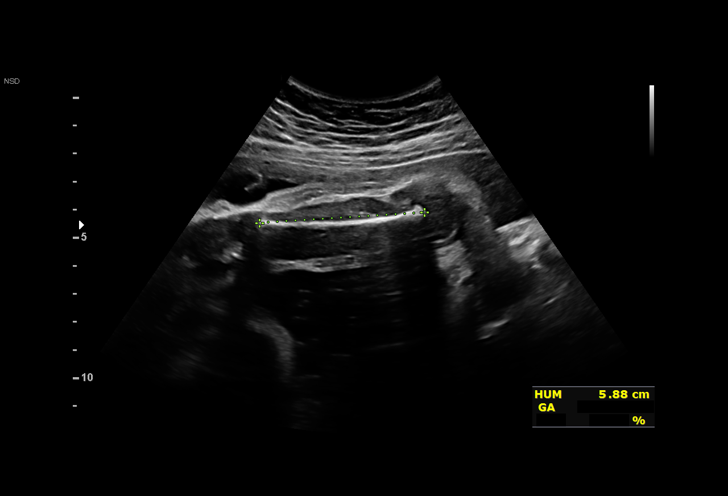
[im 9/32]
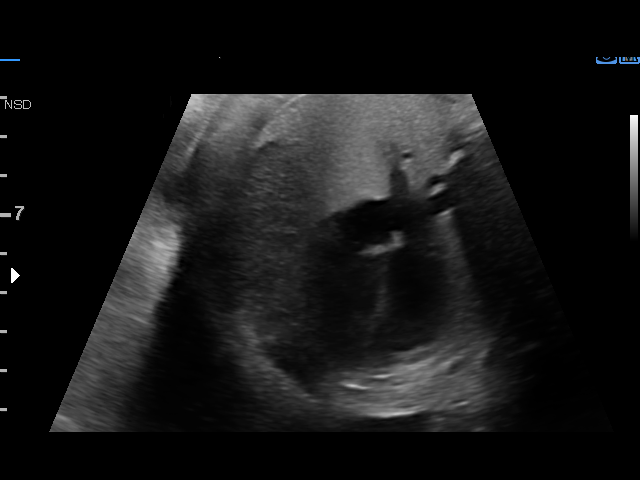
[im 11/32]
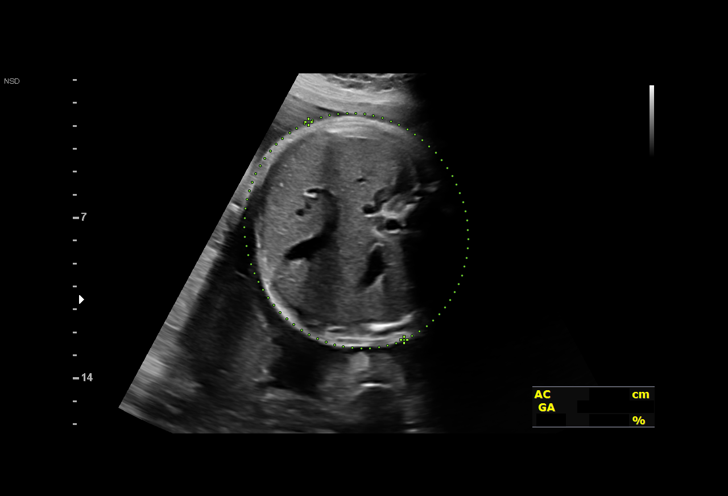
[im 13/32]
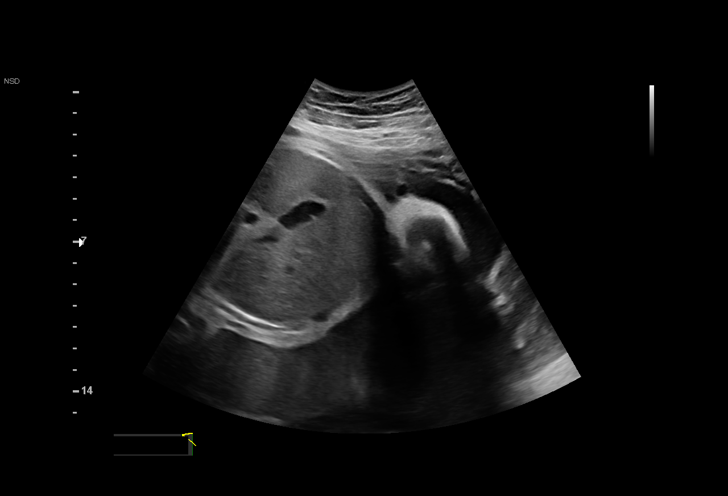
[im 15/32]
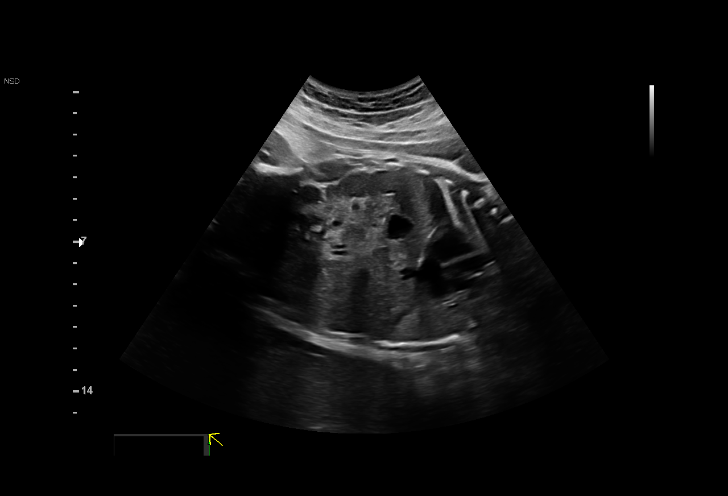
[im 18/32]
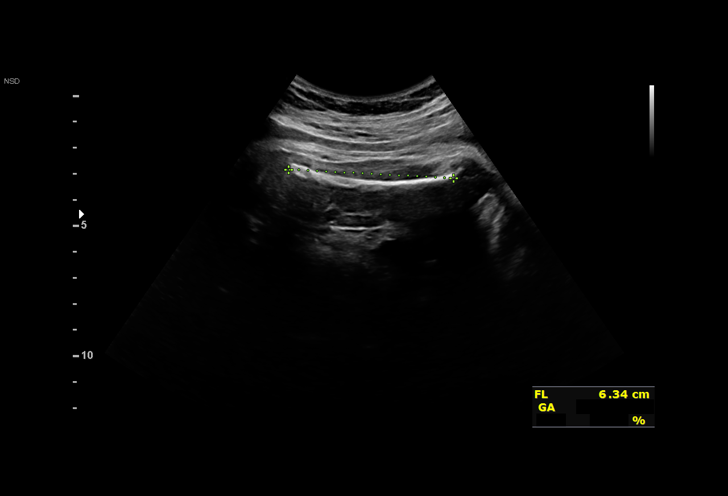
[im 20/32]
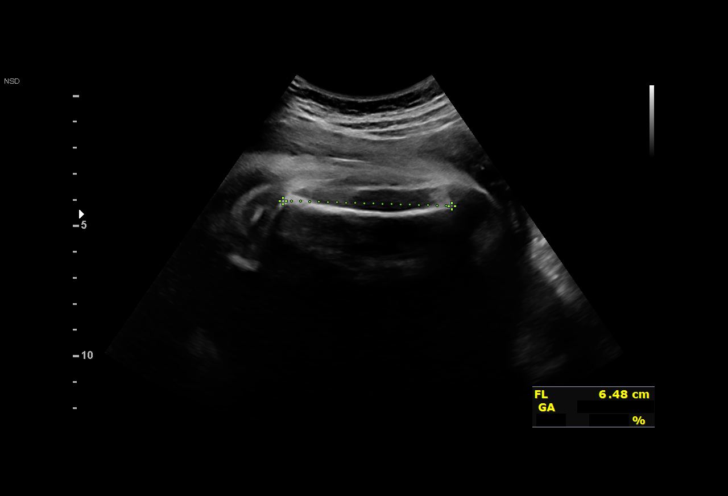
[im 22/32]
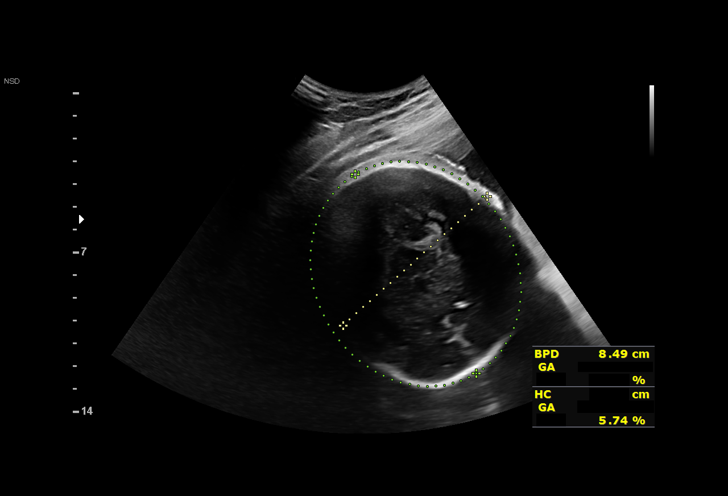
[im 25/32]
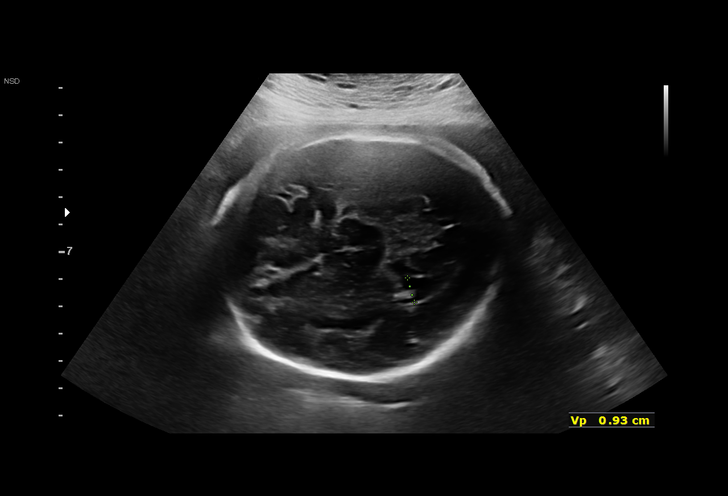
[im 27/32]
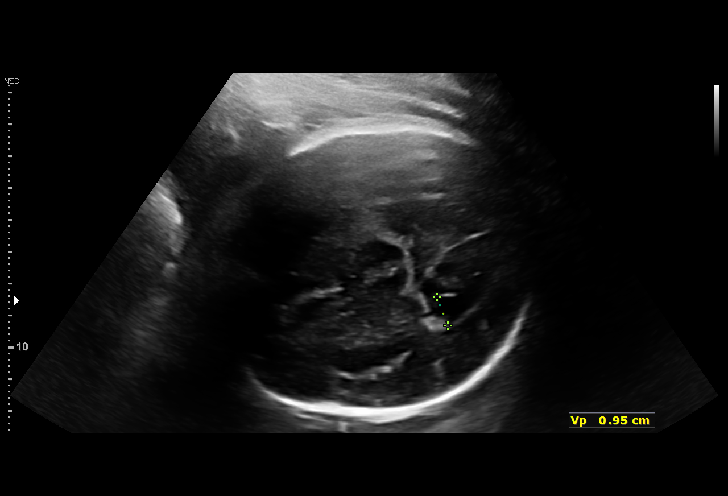
[im 29/32]
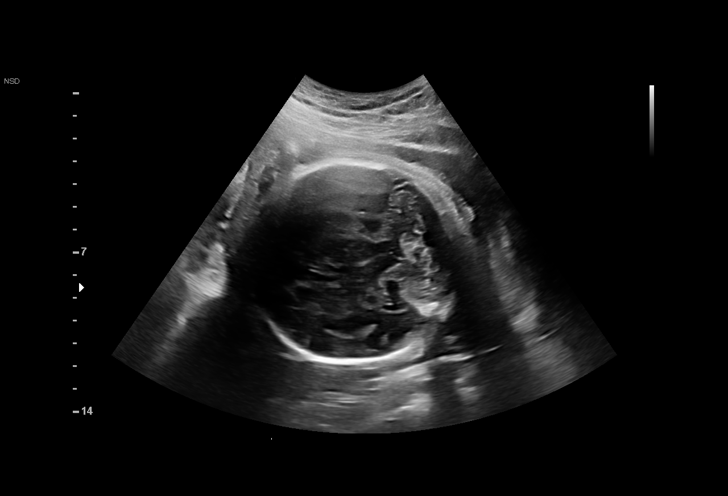
[im 32/32]
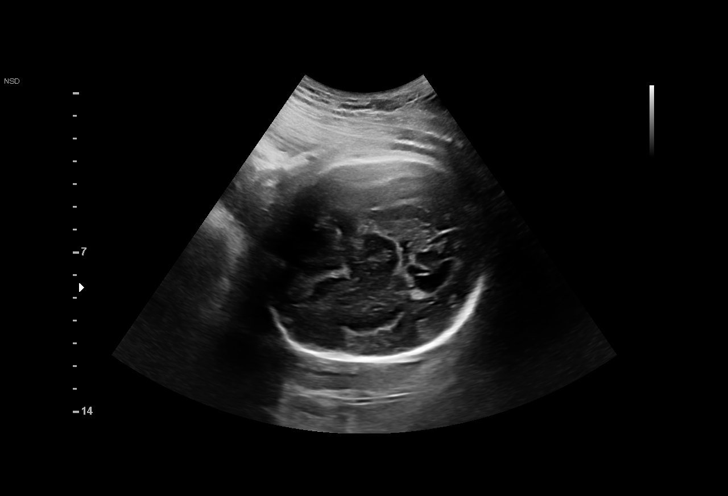

[14 of 28 positions shown; findings below may reference images not displayed]

FERIENHAUS

Indications

 Gestational diabetes in pregnancy, diet
 controlled
 34 weeks gestation of pregnancy
 Late to prenatal care, third trimester
 History of ectopic
 Encounter for antenatal screening for
 malformations
 Cerebral ventriculomegaly
Fetal Evaluation

 Num Of Fetuses:         1
 Fetal Heart Rate(bpm):  166
 Cardiac Activity:       Observed
 Presentation:           Cephalic
 Placenta:               Posterior
 P. Cord Insertion:      Previously Visualized

 Amniotic Fluid
 AFI FV:      Within normal limits

 AFI Sum(cm)     %Tile       Largest Pocket(cm)
 16.99           62

 RUQ(cm)       RLQ(cm)       LUQ(cm)        LLQ(cm)

Biometry

 BPD:      85.1  mm     G. Age:  34w 2d         53  %    CI:        78.37   %    70 - 86
                                                         FL/HC:      21.4   %    19.4 -
 HC:      304.1  mm     G. Age:  33w 6d         11  %    HC/AC:      0.96        0.96 -
 AC:      315.8  mm     G. Age:  35w 4d         88  %    FL/BPD:     76.4   %    71 - 87
 FL:         65  mm     G. Age:  33w 4d         25  %    FL/AC:      20.6   %    20 - 24
 HUM:      58.4  mm     G. Age:  33w 6d         53  %

 LV:        9.4  mm

 Est. FW:    3399  gm      5 lb 8 oz     60  %
OB History

 Blood Type:   A+
 Gravidity:    2
 Ectopic:      1
Gestational Age

 LMP:           34w 1d        Date:  06/19/20                 EDD:   03/26/21
 U/S Today:     34w 2d                                        EDD:   03/25/21
 Best:          34w 1d     Det. By:  LMP  (06/19/20)          EDD:   03/26/21
Anatomy

 Cranium:               Appears normal         LVOT:                   Previously seen
 Cavum:                 Previously seen        Aortic Arch:            Previously seen
 Ventricles:            Ventriculomegaly       Ductal Arch:            Not well visualized
                        prev seen
 Choroid Plexus:        Previously seen        Diaphragm:              Appears normal
 Cerebellum:            Appears normal         Stomach:                Appears normal, left
                                                                       sided
 Posterior Fossa:       Appears normal         Abdomen:                Appears normal
 Nuchal Fold:           Not applicable (>20    Abdominal Wall:         Previously seen
                        wks GA)
 Face:                  Orbits and profile     Cord Vessels:           Previously seen
                        previously seen
 Lips:                  Previously seen        Kidneys:                Appear normal
 Palate:                Previously seen        Bladder:                Appears normal
 Thoracic:              Appears normal         Spine:                  Previously seen
 Heart:                 Previously seen        Upper Extremities:      Previously seen
 RVOT:                  Previously seen        Lower Extremities:      Previously seen

 Other:  3VTV, BCV previously visualized. Technically difficult due to
         advanced GA and fetal position.
Cervix Uterus Adnexa

 Cervix
 Not visualized (advanced GA >21wks)
Impression

 Follow up growth due mild ventriculomegaly.
 Normal interval growth with measurements consistent with
 dates
 Good fetal movement and amniotic fluid volume

 Ms. Amenumey is doing well today. The ventricles measure normal
 at 9.5 mm. The serologies were normal.

 She was recently diagnosed with [id number] she reports normal
 blood sugars.
Recommendations

 Follow up as clinicall indicated.

## 2022-05-17 IMAGING — CT CT ANGIO CHEST
2 of 6 series · 19 of 36 positions shown · IV contrast (omnipaque)
Comparison: None.

CLINICAL DATA: PE suspected, high prob 36 weeks gestation, here
with syncope + chest pain ongoing 1 week, tachycardia, +ddimer,
evaluate for PE - patient consented regarding risks in pregnancy

EXAM:
CT ANGIOGRAPHY CHEST WITH CONTRAST
TECHNIQUE: Multidetector CT imaging of the chest was performed using the
standard protocol during bolus administration of intravenous
contrast. Multiplanar CT image reconstructions and MIPs were
obtained to evaluate the vascular anatomy.
CONTRAST:  50mL OMNIPAQUE IOHEXOL 350 MG/ML SOLN

[Series 7: pe thins · axial · 0.72mm/px · z∈[+1128,+1372]mm · 18 of 389 slices shown]
[im 20/389  lung]
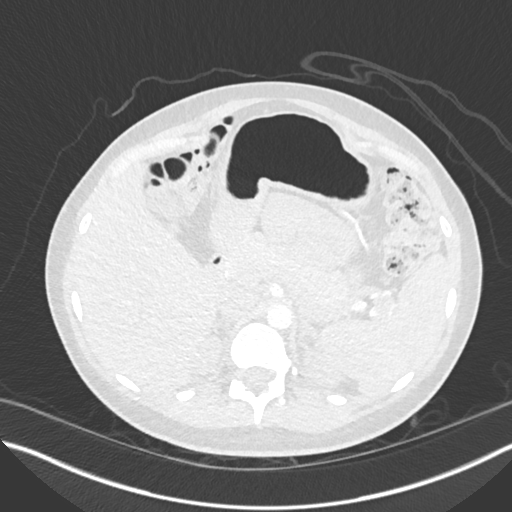
[im 39/389  mediastinal]
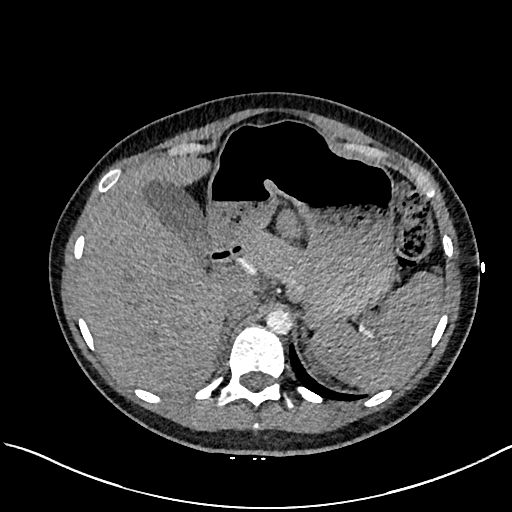
[im 59/389  lung]
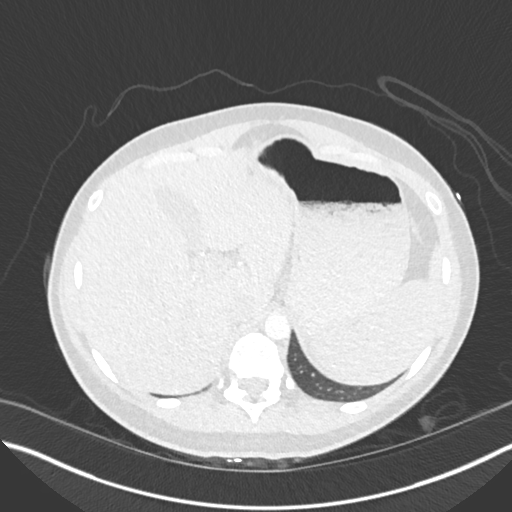
[im 78/389  mediastinal]
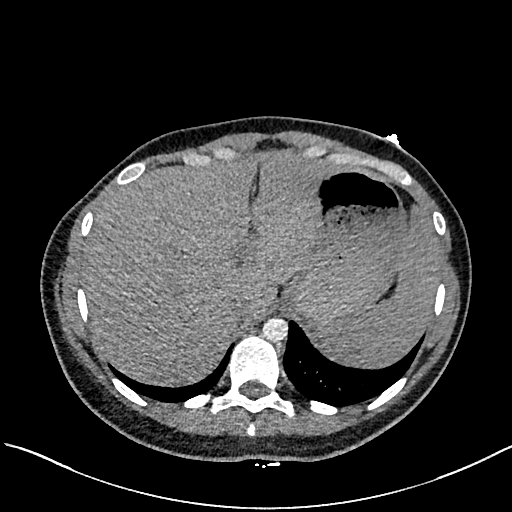
[im 98/389  lung]
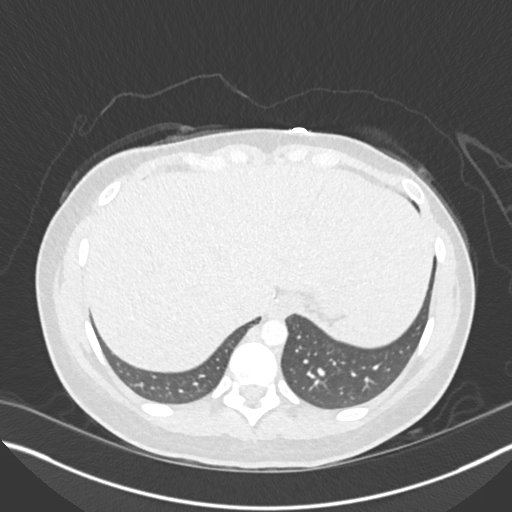
[im 117/389  mediastinal]
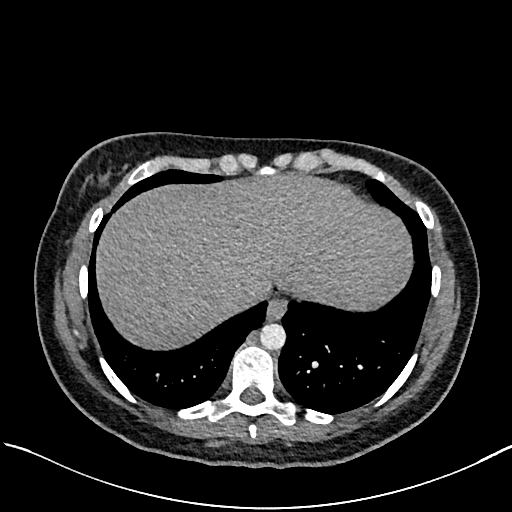
[im 136/389  lung]
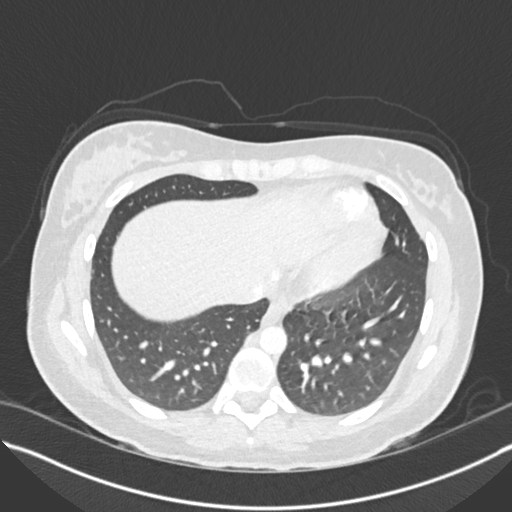
[im 156/389  mediastinal]
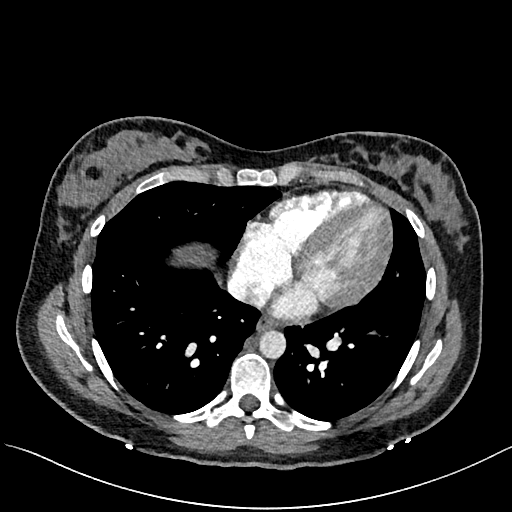
[im 175/389  lung]
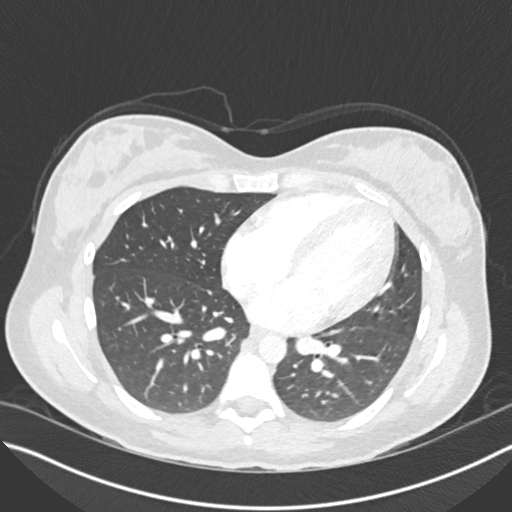
[im 214/389  mediastinal]
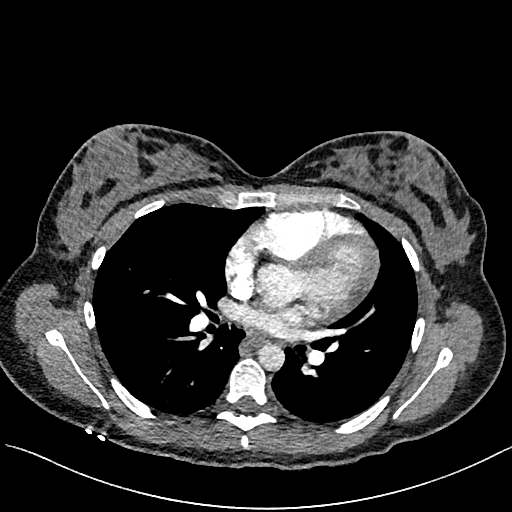
[im 233/389  lung]
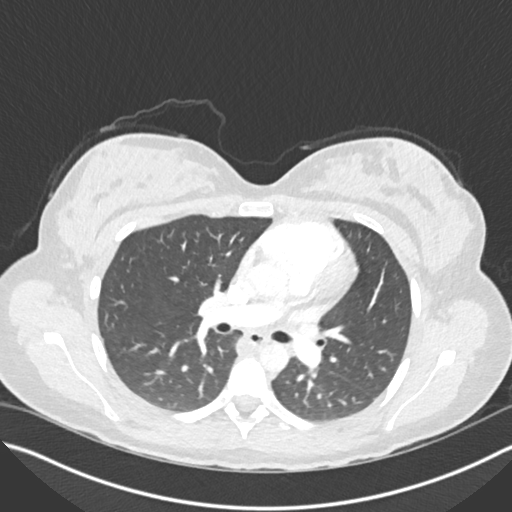
[im 253/389  mediastinal]
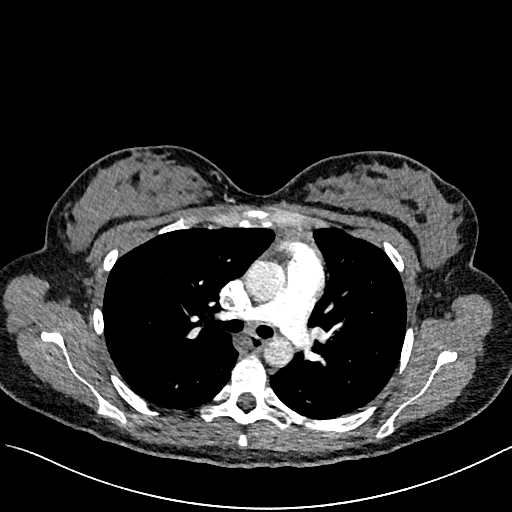
[im 272/389  lung]
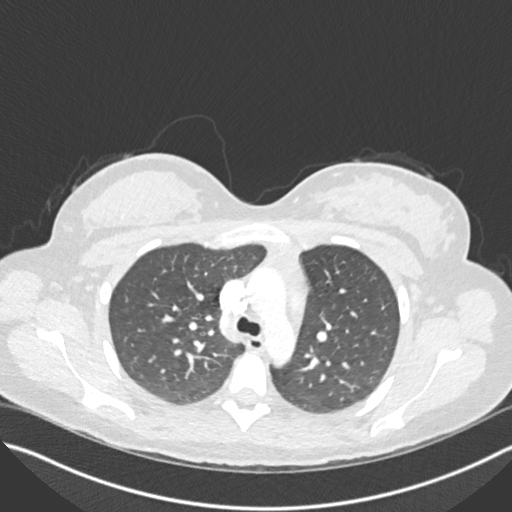
[im 292/389  mediastinal]
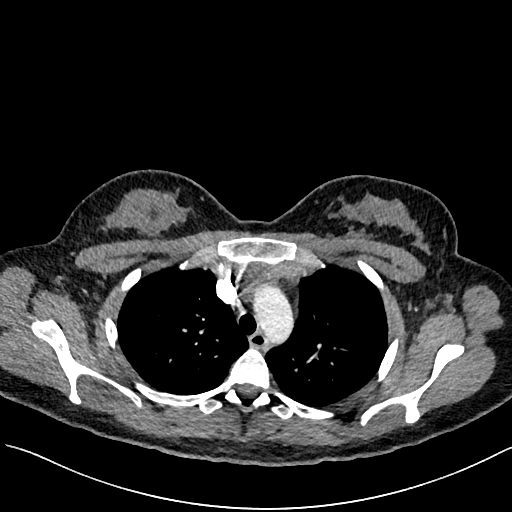
[im 311/389  lung]
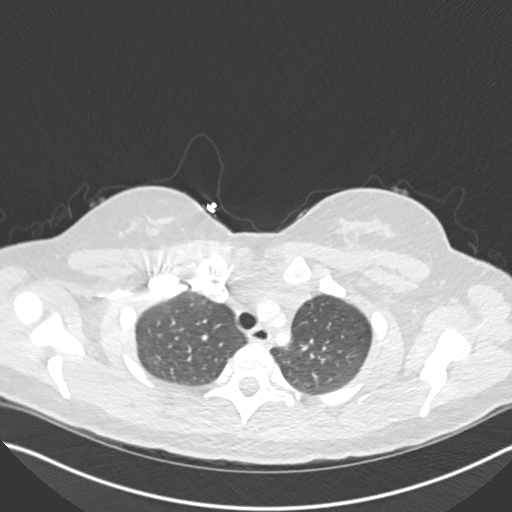
[im 330/389  mediastinal]
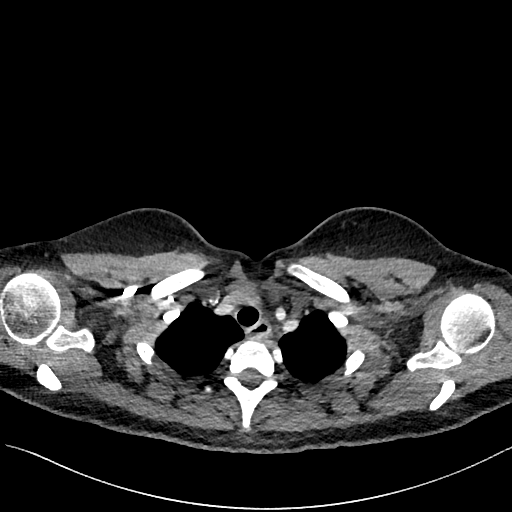
[im 350/389  lung]
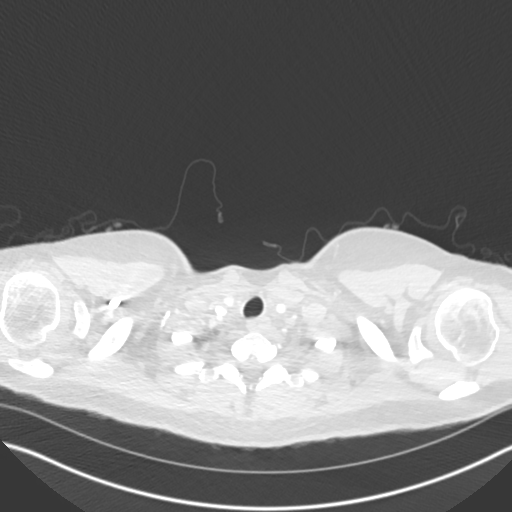
[im 369/389  mediastinal]
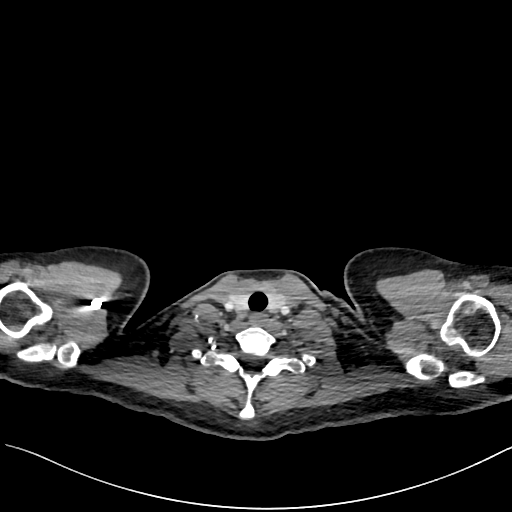

[Series 9: pe 2mm cor · coronal · 0.54mm/px · 1 of 134 slices shown]
[im 67/134  mediastinal]
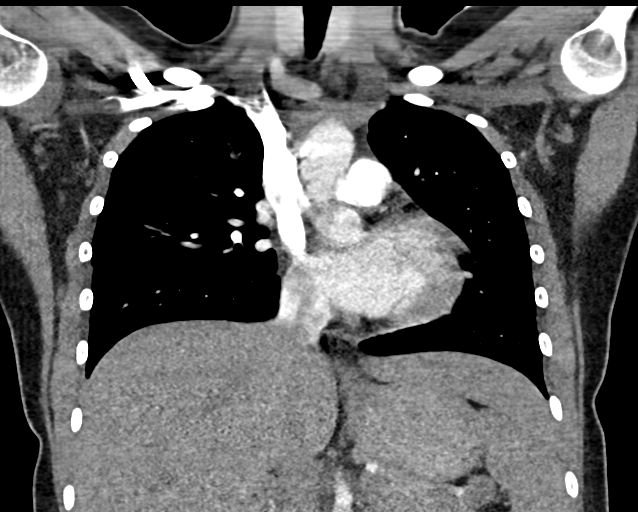

[19 of 36 positions shown; findings below may reference images not displayed]

FINDINGS: Cardiovascular: Spine No filling defects in the pulmonary arteries
to suggest pulmonary emboli. Heart is normal size. Aorta is normal
caliber.

Mediastinum/Nodes: No mediastinal, hilar, or axillary adenopathy.
Trachea and esophagus are unremarkable. Thyroid unremarkable.

Lungs/Pleura: Lungs are clear. No focal airspace opacities or
suspicious nodules. No effusions.

Upper Abdomen: Imaging into the upper abdomen demonstrates no acute
findings.

Musculoskeletal: Chest wall soft tissues are unremarkable. No acute
bony abnormality.

Review of the MIP images confirms the above findings.
IMPRESSION: No evidence of pulmonary embolus.

No acute cardiopulmonary disease.
